# Patient Record
Sex: Female | Born: 1965 | Race: White | Hispanic: No | Marital: Married | Smoking: Never smoker
Health system: Southern US, Community
[De-identification: ages and names within clinical notes are randomized; demographics above are authoritative.]

## PROBLEM LIST (undated history)

## (undated) DIAGNOSIS — E282 Polycystic ovarian syndrome: Secondary | ICD-10-CM

## (undated) DIAGNOSIS — E785 Hyperlipidemia, unspecified: Secondary | ICD-10-CM

## (undated) DIAGNOSIS — E079 Disorder of thyroid, unspecified: Secondary | ICD-10-CM

## (undated) DIAGNOSIS — I10 Essential (primary) hypertension: Secondary | ICD-10-CM

## (undated) HISTORY — DX: Hyperlipidemia, unspecified: E78.5

## (undated) HISTORY — DX: Disorder of thyroid, unspecified: E07.9

## (undated) HISTORY — DX: Polycystic ovarian syndrome: E28.2

---

## 1998-11-26 ENCOUNTER — Other Ambulatory Visit: Admission: RE | Admit: 1998-11-26 | Discharge: 1998-11-26 | Payer: Self-pay | Admitting: *Deleted

## 1999-07-22 ENCOUNTER — Other Ambulatory Visit: Admission: RE | Admit: 1999-07-22 | Discharge: 1999-07-22 | Payer: Self-pay | Admitting: *Deleted

## 2000-01-31 ENCOUNTER — Inpatient Hospital Stay (HOSPITAL_COMMUNITY): Admission: AD | Admit: 2000-01-31 | Discharge: 2000-02-03 | Payer: Self-pay | Admitting: *Deleted

## 2000-02-04 ENCOUNTER — Encounter: Admission: RE | Admit: 2000-02-04 | Discharge: 2000-02-14 | Payer: Self-pay | Admitting: *Deleted

## 2001-06-15 ENCOUNTER — Other Ambulatory Visit: Admission: RE | Admit: 2001-06-15 | Discharge: 2001-06-15 | Payer: Self-pay | Admitting: *Deleted

## 2002-10-23 ENCOUNTER — Other Ambulatory Visit: Admission: RE | Admit: 2002-10-23 | Discharge: 2002-10-23 | Payer: Self-pay | Admitting: *Deleted

## 2004-03-11 ENCOUNTER — Other Ambulatory Visit: Admission: RE | Admit: 2004-03-11 | Discharge: 2004-03-11 | Payer: Self-pay | Admitting: Obstetrics and Gynecology

## 2004-06-29 ENCOUNTER — Ambulatory Visit: Payer: Self-pay | Admitting: Cardiology

## 2005-07-08 ENCOUNTER — Other Ambulatory Visit: Admission: RE | Admit: 2005-07-08 | Discharge: 2005-07-08 | Payer: Self-pay | Admitting: Obstetrics and Gynecology

## 2008-04-03 ENCOUNTER — Encounter: Admission: RE | Admit: 2008-04-03 | Discharge: 2008-04-03 | Payer: Self-pay | Admitting: Obstetrics and Gynecology

## 2010-07-25 ENCOUNTER — Encounter (HOSPITAL_COMMUNITY): Payer: Self-pay | Admitting: Obstetrics and Gynecology

## 2010-11-10 ENCOUNTER — Encounter: Payer: Self-pay | Admitting: Emergency Medicine

## 2010-11-10 ENCOUNTER — Inpatient Hospital Stay (INDEPENDENT_AMBULATORY_CARE_PROVIDER_SITE_OTHER)
Admission: RE | Admit: 2010-11-10 | Discharge: 2010-11-10 | Disposition: A | Discharge status: Home / Self Care | Payer: BC Managed Care – PPO | Source: Ambulatory Visit | Attending: Emergency Medicine | Admitting: Emergency Medicine

## 2010-11-10 DIAGNOSIS — E039 Hypothyroidism, unspecified: Secondary | ICD-10-CM | POA: Insufficient documentation

## 2010-11-10 DIAGNOSIS — J069 Acute upper respiratory infection, unspecified: Secondary | ICD-10-CM

## 2010-11-19 NOTE — Op Note (Signed)
[facility] of [facility]  Patient:    Kelly Reeves                         MRN: [id number] Proc. Date: 02/02/00 Adm. Date:  [id number] Attending:  Donne Hazel                           Operative Report  PREOPERATIVE DIAGNOSIS: 1. Intrauterine pregnancy at term. 2. Breech presentation.  POSTOPERATIVE DIAGNOSIS: 1. Intrauterine pregnancy at term. 2. Breech presentation.  OPERATION PERFORMED:  Primary low transverse cesarean section.  SURGEON:  Willey Blade, M.D.  ANESTHESIA:  Spinal.  ESTIMATED BLOOD LOSS:  800 cc.  COMPLICATIONS:  None.  FINDINGS:  At 1307 through a low transverse uterine incision, a viable female was delivered from the footling breech presentation.  The baby was a female as mentioned, weighing 7 pounds 2 ounces.  Apgars were 6 and 9.  Cord pH 7.32.  The pelvis was visualized at time of surgery and noted to be normal.  DESCRIPTION OF PROCEDURE:  The patient was taken to the operating room where a spinal anesthetic was administered.  The patient was placed on the operating table in the left lateral tilt position.  The abdomen was prepped and draped in the usual sterile fashion with Betadine and sterile drapes.  A Foley catheter was sterilely inserted.  The abdomen was entered through a Pfannenstiel incision and carried down sharply in the usual fashion.  The peritoneum was atraumatically entered.  The vesicouterine peritoneum overlying the lower uterine segment was incised and a bladder flap was bluntly and sharply created over the lower uterine segment.  A bladder blade was then placed behind the bladder.  The uterus was then entered through a low transverse incision and carried out laterally using the operators fingers. The membranes were entered with clear fluid noted.  A footling breech presentation was noted.  The feet were grasped and the female infant was delivered easily and promptly with the usual breech maneuvers at  1307.  The oronasopharynx was thoroughly bulb suctioned and the cord doubly clamped and cut.  The baby handed promptly to the pediatricians.  Apgars were 6 and 9. The baby did well.  Cord pH 7.32.  The placenta was then manually extracted intact with three-vessel cord without difficulty.  The interior of the uterus was wiped clean thoroughly with a wet sponge.  The uterine incision was then closed in a two-layer fashion.  The first layer a running interlocking suture of #1 Vicryl.  A second imbricating suture was placed across the primary suture line with a running stitch of #1 Vicryl as well.  The pelvis was then thoroughly irrigated with copious amounts of irrigant and good hemostasis noted.  The rectus muscle and anterior peritoneum was closed with a running stitch of #1 Vicryl.  The subfascial areas were hemostatic.  The fascia was then closed with two sutures of #1 Vicryl, this was done placing two sutures at the periphery of the incision and with two running stitches meeting in the midline.  The subcutaneous tissue was irrigated and made hemostatic using Bovie cautery.  The skin reapproximated with staples and a sterile dressing applied.  Final sponge, needle and instrument counts were correct x 3.  There were no perioperative complications.  The patient did receive IV Cefotan at time of surgery. DD:  02/02/00 TD:  02/03/00 Job: [id number]

## 2010-11-19 NOTE — H&P (Signed)
[facility] of [facility]  Patient:    Kelly Reeves                         MRN: [id number] Adm. Date:  [id number] Attending:  Donne Hazel                         History and Physical  HISTORY OF PRESENT ILLNESS:   Kelly Reeves is a 45 year old married female gravida 1 at [redacted] weeks gestation.  The patient is admitted for primary cesarean section for breech presentation.  The patient has a history of PCOS.  Her pregnancy went well and breech presentation was noted recently.  She is now admitted for primary cesarean section at term for breech presentation.  Her pregnancy went uneventfully.  OBSTETRICAL LABORATORY:        Maternal blood type O positive, rubella immune, glucola elevated with normal three-hour glucose tolerance test.  Group B strep negative.  MEDICAL HISTORY:              1. History of relative infertility.  2. History of PCOS (polycystic ovarian syndrome).  SURGICAL HISTORY:             None.  CURRENT MEDICATIONS:          Prenatal vitamins and Pepcid.  ALLERGIES:                    None known.  PHYSICAL EXAMINATION:  VITAL SIGNS:                  Stable, blood pressure 128/80, fetal heart tones are reassuring.  GENERAL:                      She is a well-developed, well-nourished female in no acute distress.  HEENT:                        Within normal limits.  NECK:                         Supple without adenopathy or thyromegaly.  HEART:                        Regular rate and rhythm without murmur, gallop, or rub.  LUNGS:                        Clear to auscultation.  BREAST:                       Deferred upon admission but has been normal within the last year.  ABDOMEN:                      Benign, gravid, and nontender.  EXTREMITIES AND NEUROLOGIC:   Grossly normal.  PELVIC:                       Deferred.  LABORATORY DATA:              Ultrasound confirms breech presentation.  ADMITTING DIAGNOSES:          1.  Intrauterine pregnancy at term.  2. Breech presentation.  PLAN:                         Primary cesarean section.  DISCUSSION:                   Risks and benefits of cesarean section discussed with patient.  The risk of bleeding, infection, risk of injury to surrounding organs discussed.  She has declined an external cephalic version attempt. Questions were answered regarding cesarean delivery. DD:  02/02/00 TD:  02/02/00 Job: [id number]

## 2010-11-19 NOTE — Discharge Summary (Signed)
[facility] of [facility]  Patient:    Kelly Reeves, Kelly Reeves                        MRN: [id number] Adm. Date:  [id number] Disc. Date: [id number] Attending:  Donne Hazel Dictator:   Danie Chandler, R.N.                           Discharge Summary  ADMITTING DIAGNOSES:          1. Intrauterine pregnancy at term.                               2. Breech presentation.  DISCHARGE DIAGNOSES:          1. Intrauterine pregnancy at term.                               2. Breech presentation.  PROCEDURE:                    On February 02, 2000, primary low transverse cesarean section.  REASON FOR ADMISSION:         The patient is a 45 year old married female gravida 1 at [redacted] weeks gestation.  The patient was admitted for a primary cesarean section for a breech presentation.  The patient has a history of PCOS.  Pregnancy has gone well, and breech presentation was noted recently. The patient was thus admitted for primary cesarean section at term for breech presentation.  HOSPITAL COURSE:              The patient was taken to the operating room, and underwent the above-named procedure without complication.  This was productive of a viable female infant with Apgars of 6 at one minute and 9 at five minutes.  Postoperatively, the patient did well.  On postoperative day #1, the patients hemoglobin was 10.7, hematocrit 32.8, and white blood cell count 8.2.  On postoperative day #2, the patient was without complaint.  She had a good return of bowel function and was tolerating a regular diet.   She was also ambulating well without difficulty and had good pain control.  She was discharged home on postoperative day #1.  CONDITION ON DISCHARGE:       Good.  DIET:                         Regular as tolerated.  ACTIVITY:                     No heavy lifting, no driving, no vaginal entry.  FOLLOW-UP:                    In one to two weeks for an incision check, and she is to call for  temperature greater than 100 degrees, persistent nausea, vomiting, heavy vaginal bleeding, and/or redness or drainage from the incision site.  DISCHARGE MEDICATIONS:        1. Prenatal vitamin one p.o. q.d.                               2. Motrin 800 mg one p.o. t.i.d. p.r.n. pain.  3. Percocet 5 mg as directed by M.D. DD:  02/17/00 TD:  02/17/00 Job: [id number]

## 2010-12-14 ENCOUNTER — Other Ambulatory Visit: Payer: Self-pay | Admitting: Obstetrics & Gynecology

## 2010-12-17 ENCOUNTER — Ambulatory Visit
Admission: RE | Admit: 2010-12-17 | Discharge: 2010-12-17 | Disposition: A | Discharge status: Home / Self Care | Payer: BC Managed Care – PPO | Source: Ambulatory Visit | Attending: Obstetrics & Gynecology | Admitting: Obstetrics & Gynecology

## 2011-06-06 NOTE — Progress Notes (Signed)
Summary: SORE THROAT/FEVER/COUGH   Vital Signs:  Patient Profile:   45 Years Old Female CC:      sore throat, dry cough, HA x 6 days, fever last night Height:     64 inches Weight:      240 pounds O2 Sat:      96 % O2 treatment:    Room Air Temp:     98.5 degrees F oral Pulse rate:   104 / minute Resp:     16 per minute BP sitting:   127 / 87  (left arm) Cuff size:   large  Vitals Entered By: Lajean Saver RN (Nov 10, 2010 12:25 PM)                  Updated Prior Medication List: LEVOTHYROXINE SODIUM 50 MCG TABS (LEVOTHYROXINE SODIUM)  JUNEL 1/20 1-20 MG-MCG TABS (NORETHINDRONE ACET-ETHINYL EST)   Current Allergies: No known allergies History of Present Illness History from: patient Chief Complaint: sore throat, dry cough, HA x 6 days, fever last night History of Present Illness: 45 Years Old Female complains of onset of cold symptoms for 6 days.  Kelly Reeves has been using nothing OTC. She went to [facility] a few days ago and tested negative for strep and flu. No sore throat + cough No pleuritic pain No wheezing No nasal congestion + post-nasal drainage + sinus pain/pressure + chest congestion No itchy/red eyes No earache No hemoptysis No SOB No chills/sweats No fever No nausea No vomiting No abdominal pain No diarrhea No skin rashes No fatigue No myalgias No headache   REVIEW OF SYSTEMS Constitutional Symptoms       Complains of fever and fatigue.     Denies chills, night sweats, weight loss, and weight gain.  Eyes       Denies change in vision, eye pain, eye discharge, glasses, contact lenses, and eye surgery. Ear/Nose/Throat/Mouth       Complains of sore throat.      Denies hearing loss/aids, change in hearing, ear pain, ear discharge, dizziness, frequent runny nose, frequent nose bleeds, sinus problems, hoarseness, and tooth pain or bleeding.  Respiratory       Complains of dry cough, wheezing, and shortness of breath.      Denies productive cough,  asthma, bronchitis, and emphysema/COPD.  Cardiovascular       Denies murmurs, chest pain, and tires easily with exhertion.    Gastrointestinal       Denies stomach pain, nausea/vomiting, diarrhea, constipation, blood in bowel movements, and indigestion. Genitourniary       Denies painful urination, kidney stones, and loss of urinary control. Neurological       Complains of headaches.      Denies paralysis, seizures, and fainting/blackouts. Musculoskeletal       Denies muscle pain, joint pain, joint stiffness, decreased range of motion, redness, swelling, muscle weakness, and gout.  Skin       Denies bruising, unusual mles/lumps or sores, and hair/skin or nail changes.  Psych       Denies mood changes, temper/anger issues, anxiety/stress, speech problems, depression, and sleep problems. Other Comments: [facility]. Rapid flu and strep were negative   Past History:  Past Medical History: Hypothyroidism POCD  Past Surgical History: Caesarean section  Family History: Family History Diabetes 1st degree relative- mother  Social History: Occupation: [employer] Never Smoked Alcohol use-no Drug use-no Married Smoking Status:  never Drug Use:  no Physical Exam General  appearance: well developed, well nourished, no acute distress Ears: normal, no lesions or deformities Nasal: mucosa pink, nonedematous, no septal deviation, turbinates normal Oral/Pharynx: tongue normal, posterior pharynx without erythema or exudate Chest/Lungs: no rales, wheezes, or rhonchi bilateral, breath sounds equal without effort Heart: regular rate and  rhythm, no murmur MSE: oriented to time, place, and person Assessment New Problems: FAMILY HISTORY DIABETES 1ST DEGREE RELATIVE (ICD-V18.0) HYPOTHYROIDISM (ICD-244.9) UPPER RESPIRATORY INFECTION, ACUTE (ICD-465.9)   Plan New Orders: New Patient Level III [99203] Pulse Oximetry (single measurment) [94760] Planning Comments:    I think this is a viral cold.  She reports that she is feeling better today but her work wanted her to get checked out.  Encourage OTC meds.  If cough worsens, pt to call and we will call in [facility].  If fever or other symptoms worsening, she will call in and we will Rx Zpak. Use nasal saline solution (over the counter) at least 3 times a day. Use over the counter decongestants like Zyrtec-D every 12 hours as needed to help with congestion. Can take tylenol every 6 hours or motrin every 8 hours for pain or fever. Follow up with your primary doctor  if no improvement in 5-7 days, sooner if increasing pain, fever, or new symptoms.    The patient and/or caregiver has been counseled thoroughly with regard to medications prescribed including dosage, schedule, interactions, rationale for use, and possible side effects and they verbalize understanding.  Diagnoses and expected course of recovery discussed and will return if not improved as expected or if the condition worsens. Patient and/or caregiver verbalized understanding.   Orders Added: 1)  New Patient Level III [99203] 2)  Pulse Oximetry (single measurment) [40347]

## 2011-10-19 ENCOUNTER — Ambulatory Visit (INDEPENDENT_AMBULATORY_CARE_PROVIDER_SITE_OTHER): Payer: BC Managed Care – PPO | Admitting: Physician Assistant

## 2011-10-19 ENCOUNTER — Encounter: Payer: Self-pay | Admitting: Physician Assistant

## 2011-10-19 VITALS — BP 153/92 | HR 94 | Ht 64.0 in | Wt 263.0 lb

## 2011-10-19 DIAGNOSIS — I10 Essential (primary) hypertension: Secondary | ICD-10-CM

## 2011-10-19 DIAGNOSIS — E88819 Insulin resistance, unspecified: Secondary | ICD-10-CM

## 2011-10-19 DIAGNOSIS — E785 Hyperlipidemia, unspecified: Secondary | ICD-10-CM

## 2011-10-19 DIAGNOSIS — E282 Polycystic ovarian syndrome: Secondary | ICD-10-CM

## 2011-10-19 DIAGNOSIS — E669 Obesity, unspecified: Secondary | ICD-10-CM

## 2011-10-19 DIAGNOSIS — E8881 Metabolic syndrome: Secondary | ICD-10-CM

## 2011-10-19 DIAGNOSIS — Z23 Encounter for immunization: Secondary | ICD-10-CM

## 2011-10-19 MED ORDER — LISINOPRIL 20 MG PO TABS: 20.0000 mg | ORAL_TABLET | Freq: Every day | ORAL | Status: DC

## 2011-10-19 NOTE — Patient Instructions (Addendum)
Start lisinopril daily. Will get labs and call with results. Follow up with endocrinologist. Recheck in 1 month. Have conversation with husband about vasectomy so you can go off birth control.

## 2011-10-19 NOTE — Progress Notes (Signed)
  Subjective:    Patient ID: Kelly Reeves, female    DOB: 07/03/1966, 46 y.o.   MRN: [id number]  HPI Patient is here to establish care. Her hx was reviewed and documented. She has PCOS and hypothyriodsim that an endocrinologist follows. She comes to PCP to help her with blood pressure problems and to help her lose weight. She is currently not doing anything to lose weight as far as exercise or diet. She wonders about appetite suppressants. She was told she had high cholesterol but has not been treated. Her mother had a carotid screening last month and her arteries were 90 and 60 percent blocked and this concerns her. She denies any headaches, chest pains or SOB. She still takes OCP because she does not want to become pregnant. Been on OCP for 20 plus years.    Review of Systems     Objective:   Physical Exam  Constitutional: She is oriented to person, place, and time. She appears well-developed and well-nourished.       Obese.  HENT:  Head: Normocephalic and atraumatic.  Eyes: Conjunctivae are normal.  Neck: Normal range of motion. Neck supple. No thyromegaly present.  Cardiovascular: Normal rate, regular rhythm, normal heart sounds and intact distal pulses.   Pulmonary/Chest: Effort normal and breath sounds normal. She has no wheezes.  Abdominal: Soft. Bowel sounds are normal.  Neurological: She is alert and oriented to person, place, and time.  Skin: Skin is warm and dry.  Psychiatric: She has a normal mood and affect. Her behavior is normal.          Assessment & Plan:  Hyperlipidemia- Patient is not on medication but was told she had elevated LDL. Will get lipid panel today and call with results.  HTN- Started on Lisinopril 20mg  daily. Educated patient about importance of diet and weight loss with blood pressure reduction. Patient was encouraged to avoid salt in her diet. She was encouraged to start walking 4-5times a week. Will recheck in 1 month.  PCOS/Inusulin  resistance- Got CMP will call with results. Endocrinology is following this.  Birth control- Discussed with patient getting off birth control because of blood pressure and age. Patient is going to talk with her husband about a vasectomy.   Obesity- I told her that endocrinology works a lot with resistant weight loss. She has not been to her follow up appointments and I encouraged her to go and get blood work to make sure hormones were in check. I also gave her quick tips on diet with increasing protein and limiting carbs and exercise. I mentioned a breast reduction due to very large breast and her difficulty exercising. She does have minimal back pain due to breast but had not consider in the past getting a reduction. She is on OCP and this may contribute to some weight gain. I think we need to work on getting her off these due to age and elevated bp. It may also help with weight. Appetite suppressants are not an option until we get bp more managable.  Need Tdap.

## 2011-10-20 LAB — COMPLETE METABOLIC PANEL WITH GFR
ALT: 38 U/L — ABNORMAL HIGH (ref 0–35)
AST: 38 U/L — ABNORMAL HIGH (ref 0–37)
Alkaline Phosphatase: 79 U/L (ref 39–117)
BUN: 10 mg/dL (ref 6–23)
Creat: 0.65 mg/dL (ref 0.50–1.10)

## 2011-10-20 LAB — LIPID PANEL
Cholesterol: 329 mg/dL — ABNORMAL HIGH (ref 0–200)
LDL Cholesterol: 206 mg/dL — ABNORMAL HIGH (ref 0–99)
Total CHOL/HDL Ratio: 7.3 Ratio
Triglycerides: 391 mg/dL — ABNORMAL HIGH (ref <150)
VLDL: 78 mg/dL — ABNORMAL HIGH (ref 0–40)

## 2011-10-21 NOTE — Patient Instructions (Addendum)
Start lisinopril daily and lets recheck in 1 month. Consider talking with husband about getting a vasectomy so you can go off of OCPs. Start walking 4-5 times a week for exercise. Limit salt and add more protien to diet. Follow up with endocrinologist. Get blood work and will call with results.

## 2011-10-22 DIAGNOSIS — I10 Essential (primary) hypertension: Secondary | ICD-10-CM | POA: Insufficient documentation

## 2011-10-22 DIAGNOSIS — E785 Hyperlipidemia, unspecified: Secondary | ICD-10-CM | POA: Insufficient documentation

## 2011-10-22 DIAGNOSIS — E282 Polycystic ovarian syndrome: Secondary | ICD-10-CM | POA: Insufficient documentation

## 2011-11-03 ENCOUNTER — Telehealth: Payer: Self-pay | Admitting: *Deleted

## 2011-11-03 NOTE — Telephone Encounter (Signed)
PT states she started Lisinopril on Monday and has taken it for 3 days. In this time period her eyes started swelling and rash and as of this morning she states that her whole face is broke out in hives and swollen. Informed pt she can take a benadryl at this time. Please advise.

## 2011-11-03 NOTE — Telephone Encounter (Signed)
Stop lisinopril. Add to allergy list.  Put her on Jades sched for tomorrow. If gets any SOB then needs to go to ED but the benadryl should really help.

## 2011-11-03 NOTE — Telephone Encounter (Signed)
Pt informed and making appt 

## 2011-11-04 ENCOUNTER — Encounter: Payer: Self-pay | Admitting: Physician Assistant

## 2011-11-04 ENCOUNTER — Ambulatory Visit (INDEPENDENT_AMBULATORY_CARE_PROVIDER_SITE_OTHER): Payer: BC Managed Care – PPO | Admitting: Physician Assistant

## 2011-11-04 VITALS — BP 107/68 | HR 86 | Temp 98.2°F | Ht 64.0 in | Wt 266.0 lb

## 2011-11-04 DIAGNOSIS — T7840XA Allergy, unspecified, initial encounter: Secondary | ICD-10-CM

## 2011-11-04 DIAGNOSIS — R634 Abnormal weight loss: Secondary | ICD-10-CM

## 2011-11-04 DIAGNOSIS — I10 Essential (primary) hypertension: Secondary | ICD-10-CM

## 2011-11-04 DIAGNOSIS — E039 Hypothyroidism, unspecified: Secondary | ICD-10-CM

## 2011-11-04 NOTE — Patient Instructions (Addendum)
Not going to prescribe any more blood pressure mediation. I know you want to go on phentermine so we will schedule a nurse visit in 3-4 weeks. If controlled scheduled office visit to start on phentarmine. Start on lipitor so we can get your cholesterol under control and recheck in 2 months. Continue to use Bendaryl for bumps on face. Get labs and will call with results on Monday.

## 2011-11-04 NOTE — Progress Notes (Signed)
  Subjective:    Patient ID: Kelly Reeves, female    DOB: 07-31-1965, 46 y.o.   MRN: [id number]  HPI Patient presents to the clinic because she believes that she had an allergic reaction to the lisinopril. She started lisinopril on Monday and by Tuesday she had tiny red itchy bumps all over her her face and her eyes seemed to be swollen. She used Benadryl and cold compresses. That seemed to help a lot. She continued to have lisinopril until yesterday which was her last dose. It seems that the bumps are much better today. Her blood pressure is wonderful today and she thinks that her blood pressure is normally really good. She denies any shortness of breath or problems swallowing.   The patient the Lipitor she stated she had not started taking it yet. She does express at today's visit she really wants to start on phentermine as soon as possible. She had great results with medication previously. She has started exercising daily by walking.  Patient is frustrated with not been able to get into her endocrinologist. She would like her need to take over managing her levothyroxine. She has not had her thyroid checked in over a year.    Review of Systems     Objective:   Physical Exam  Constitutional: She is oriented to person, place, and time. She appears well-developed and well-nourished. No distress.  HENT:  Head: Normocephalic and atraumatic.  Eyes: Conjunctivae are normal. Right eye exhibits no discharge. Left eye exhibits no discharge.       There is no eye swelling today.  Cardiovascular: Normal rate, regular rhythm and normal heart sounds.   Pulmonary/Chest: Effort normal and breath sounds normal. She has no wheezes.  Neurological: She is alert and oriented to person, place, and time.  Skin: She is not diaphoretic.       Tiny red papules with some erythema over bilateral cheek bones and down into the lower jaw area.  Psychiatric: She has a normal mood and affect. Her behavior is normal.           Assessment & Plan:  Allergic reaction- we did the lisinopril on her allergic list she was told not to take any more. Patient did not want to be put on any type of prednisone. She does think that her bumps are slowly going away and she will remain on Benadryl as needed.  HTN/weight loss- her blood pressure looked wonderful today. We will not start any new blood pressure medicine today. Patient will followup with nurse visit in 3 weeks. If still in normal range she will schedule appointment to be started on phentermine. She has started exercising regularly. She has made some diet changes but she knows that this is the next step in weight loss.  Hypothyroidism-I will take over managing her hypothyroidism. We will get a TSH today. Patient will be called with results and any changes will be made at that time.  She is aware that she needs a complete physical and will schedule.

## 2011-11-05 LAB — T3, FREE: T3, Free: 2.7 pg/mL (ref 2.3–4.2)

## 2011-11-05 LAB — TSH: TSH: 3.057 u[IU]/mL (ref 0.350–4.500)

## 2011-11-05 LAB — T4, FREE: Free T4: 1 ng/dL (ref 0.80–1.80)

## 2011-11-23 ENCOUNTER — Ambulatory Visit: Payer: BC Managed Care – PPO | Admitting: Physician Assistant

## 2011-12-02 ENCOUNTER — Ambulatory Visit (INDEPENDENT_AMBULATORY_CARE_PROVIDER_SITE_OTHER): Payer: BC Managed Care – PPO | Admitting: Physician Assistant

## 2011-12-02 ENCOUNTER — Encounter: Payer: Self-pay | Admitting: Physician Assistant

## 2011-12-02 VITALS — BP 136/83 | HR 83 | Temp 98.0°F | Wt 266.0 lb

## 2011-12-02 DIAGNOSIS — R82998 Other abnormal findings in urine: Secondary | ICD-10-CM

## 2011-12-02 DIAGNOSIS — I10 Essential (primary) hypertension: Secondary | ICD-10-CM

## 2011-12-02 DIAGNOSIS — Z8249 Family history of ischemic heart disease and other diseases of the circulatory system: Secondary | ICD-10-CM

## 2011-12-02 DIAGNOSIS — R8290 Unspecified abnormal findings in urine: Secondary | ICD-10-CM

## 2011-12-02 DIAGNOSIS — E785 Hyperlipidemia, unspecified: Secondary | ICD-10-CM

## 2011-12-02 LAB — POCT URINALYSIS DIPSTICK
Glucose, UA: NEGATIVE
Leukocytes, UA: NEGATIVE
Nitrite, UA: NEGATIVE
Urobilinogen, UA: 0.2

## 2011-12-02 MED ORDER — ATORVASTATIN CALCIUM 40 MG PO TABS: 40.0000 mg | ORAL_TABLET | Freq: Every day | ORAL | Status: DC

## 2011-12-02 MED ORDER — LOSARTAN POTASSIUM-HCTZ 50-12.5 MG PO TABS: 1.0000 | ORAL_TABLET | Freq: Every day | ORAL | Status: DC

## 2011-12-02 NOTE — Patient Instructions (Addendum)
Can start taking an asprin daily. Lipitor 40mg  once a day along with Losartan/HCTZ once a day. Recheck in 2 month. Start walking. Will send culture. Call office if not improving after your period resolves.

## 2011-12-04 NOTE — Progress Notes (Signed)
  Subjective:    Patient ID: Kelly Reeves, female    DOB: Apr 17, 1966, 46 y.o.   MRN: [id number]  HPI Patient came in for blood pressure check and noticed an orange color to urine. She wanted to be checked for UTI. She denies any pain, discomfort, pressure. She has not had a fever, chills, muscle aches, of vaginal discharge. She has only had orange color urine for today and not noticed before. She has not started any new medications or eaten any carrots last night. Not tried anything to make better.   Never started Lipitor. Needs to be called in to a different pharmacy. Blood presure elevated. Will start bp meds. Brother had a heart attack last week at 22. Pt is very scared that she is next. She wants to know how to prevent this. Denies CP, SOB, palpiations, dizziness, not feeling well and headaches. Not exercising or eating healthy.    Review of Systems     Objective:   Physical Exam  Constitutional: She is oriented to person, place, and time. She appears well-developed and well-nourished.       Obese.  HENT:  Head: Normocephalic and atraumatic.  Eyes: Conjunctivae are normal.  Neck: Normal range of motion. Neck supple.  Cardiovascular: Normal rate, regular rhythm, normal heart sounds and intact distal pulses.   Pulmonary/Chest: Effort normal and breath sounds normal.       No cva tenderness.  Abdominal: Soft. Bowel sounds are normal. There is no tenderness.  Neurological: She is alert and oriented to person, place, and time.  Skin: Skin is warm and dry.  Psychiatric: She has a normal mood and affect. Her behavior is normal.          Assessment & Plan:  Urine abnormality- UA positive for blood and orange color urine, no leuks or nitrites. Reports to have started period today. Will send urine for culture. If continue to have problems after menstural cycle call office. Likely due to something you ate or the start of your peroid.   Hyperlipidemia- Called in lipitor 40mg  to start  daily. Encouraged patient to exercise daily even if it is just walking for 30 minutes. Will recheck in 2 months.  hypertension- Start Hyzarr for blood pressure daily. Call if any allergic reaction or not able to tolerate. Will recheck in 2 months.   Family hx of heart attack- Reassured patient that the best treatment is to control HTN, Hyperlipidemia, not progress to diabetes and to lose weight. We are working on all of those treatments. Can start ASA baby daily. Make better diet decsions also. At 2 month lets get a EKG for baseline.

## 2011-12-05 LAB — CULTURE, URINE COMPREHENSIVE

## 2012-02-03 ENCOUNTER — Ambulatory Visit: Payer: BC Managed Care – PPO | Admitting: Physician Assistant

## 2012-02-28 ENCOUNTER — Other Ambulatory Visit: Payer: Self-pay | Admitting: Physician Assistant

## 2012-04-04 ENCOUNTER — Ambulatory Visit (INDEPENDENT_AMBULATORY_CARE_PROVIDER_SITE_OTHER): Payer: BC Managed Care – PPO | Admitting: Physician Assistant

## 2012-04-04 ENCOUNTER — Encounter: Payer: Self-pay | Admitting: Physician Assistant

## 2012-04-04 VITALS — BP 123/76 | HR 79 | Ht 64.0 in | Wt 267.0 lb

## 2012-04-04 DIAGNOSIS — I10 Essential (primary) hypertension: Secondary | ICD-10-CM

## 2012-04-04 DIAGNOSIS — E669 Obesity, unspecified: Secondary | ICD-10-CM

## 2012-04-04 DIAGNOSIS — E785 Hyperlipidemia, unspecified: Secondary | ICD-10-CM

## 2012-04-04 DIAGNOSIS — Z23 Encounter for immunization: Secondary | ICD-10-CM

## 2012-04-04 DIAGNOSIS — E039 Hypothyroidism, unspecified: Secondary | ICD-10-CM

## 2012-04-04 MED ORDER — PHENTERMINE HCL 37.5 MG PO CAPS: 37.5000 mg | ORAL_CAPSULE | ORAL | Status: DC

## 2012-04-04 NOTE — Progress Notes (Addendum)
  Subjective:    Patient ID: Kelly Reeves, female    DOB: February 27, 1966, 46 y.o.   MRN: [id number]  HPI Patient is a 46 year old female who presents to the clinic to followup own hypertension, hypothyroidism, hyperlipidemia and problems with weight loss.  Hypertension-patient has been off Hyzaar for the past 2-3 weeks and blood pressure is still control. Patient denies any chest pains, palpitations, headaches, or vision changes.  Hypothyroidism/obesity-patient's TSH was last checked in May and patient was kept on same dose of Synthroid. For the last 6 weeks she has tried to lose weight by speaking to a 1200-1500-calorie diet and exercise and regularly. She has not been able  to lose weight. She is very frustrated. Patient has very large breasts and she reports that the large breasts make it really hard for her to exercise and she reports significant back pain. She has considered her breast reduction. She has not seen a Engineer, petroleum for consult at this time. She denies any hair loss. She has been more cold intolerant lately.  Hyperlipidemia-patient has severe muscle aches with Lipitor. She has not been taking. She denies any muscle pain or cramps today.   Review of Systems     Objective:   Physical Exam  Constitutional: She is oriented to person, place, and time. She appears well-developed and well-nourished.       obese  HENT:  Head: Normocephalic and atraumatic.  Neck: Normal range of motion. Neck supple. No thyromegaly present.  Cardiovascular: Normal rate, regular rhythm and normal heart sounds.   Pulmonary/Chest: Effort normal and breath sounds normal. She has no wheezes.  Neurological: She is alert and oriented to person, place, and time.  Skin: Skin is warm and dry.  Psychiatric: She has a normal mood and affect. Her behavior is normal.          Assessment & Plan:  Hypertension-blood pressure well controlled today despite not being on medication. Will not refill medications  can continue to monitor blood pressure. Followup in 1 month for blood pressure check.  Hypothyroidism/obesity-since patient is having a harder time losing weight will order TSH to see if any medication adjustments need to be made. Encouraged patient to continue regular exercise and low-calorie diet. Did give phentermine to start taking as appetite suppressant. We'll continue to monitor monthly for increase in blood pressure or side effects. Discussed with patient the importance of small meals during the day.  Hyperlipidemia-the need to recheck cholesterol level since patient has not been on Lipitor. Lipitor was added to the intolerant list. Did give samples of level 2 mg to take daily. Patient was told to call office if she did not experience side effects and we could give her a prescription. Did discuss with patient that if she did have side effects with little low to consider Welchol.  Flu shot given.

## 2012-04-04 NOTE — Patient Instructions (Addendum)
Let's get TSH and will call with results.   Will give samples of livalo and see if you are able to tolerate.

## 2012-04-14 LAB — T4, FREE: TSH: 1.233

## 2012-04-17 ENCOUNTER — Telehealth: Payer: Self-pay | Admitting: *Deleted

## 2012-04-17 NOTE — Telephone Encounter (Signed)
Pt calling wanting lab ressults

## 2012-04-18 ENCOUNTER — Telehealth: Payer: Self-pay | Admitting: Physician Assistant

## 2012-04-18 NOTE — Telephone Encounter (Signed)
LMOM informing Pt  

## 2012-04-18 NOTE — Telephone Encounter (Signed)
Will you call downstairs and find out why we never received any results for patient? Let patient know we were never resulted with anything and will let her know as soon as we do about results.

## 2012-04-18 NOTE — Telephone Encounter (Signed)
Call pt: Labs were never sent to [place]. TSH is great. It's actually more toward the Hyper side of normal. T3 and t4 are also both normal.

## 2012-04-19 ENCOUNTER — Encounter: Payer: Self-pay | Admitting: *Deleted

## 2012-05-02 ENCOUNTER — Encounter: Payer: BC Managed Care – PPO | Admitting: Family Medicine

## 2012-05-02 ENCOUNTER — Other Ambulatory Visit: Payer: Self-pay | Admitting: Physician Assistant

## 2012-05-02 ENCOUNTER — Ambulatory Visit (INDEPENDENT_AMBULATORY_CARE_PROVIDER_SITE_OTHER): Payer: BC Managed Care – PPO | Admitting: Physician Assistant

## 2012-05-02 VITALS — BP 139/90 | HR 87 | Wt 260.0 lb

## 2012-05-02 DIAGNOSIS — R635 Abnormal weight gain: Secondary | ICD-10-CM

## 2012-05-02 DIAGNOSIS — E669 Obesity, unspecified: Secondary | ICD-10-CM

## 2012-05-02 MED ORDER — PITAVASTATIN CALCIUM 2 MG PO TABS: ORAL_TABLET | ORAL | Status: DC

## 2012-05-02 MED ORDER — PHENTERMINE HCL 30 MG PO CAPS: 30.0000 mg | ORAL_CAPSULE | ORAL | Status: DC

## 2012-05-02 MED ORDER — METFORMIN HCL 1000 MG PO TABS: 1000.0000 mg | ORAL_TABLET | Freq: Two times a day (BID) | ORAL | Status: DC

## 2012-05-02 NOTE — Progress Notes (Signed)
Kelly Reeves will you confirm dosage of metformin at 2,000 daily?   Also have coupon at office for 1 free month of livalo and then pick up card to take to pharmacy for discount price. Will need to pick up. Will send metformin to pharmacy once dose confirmed.

## 2012-05-02 NOTE — Progress Notes (Signed)
Pt is taking 2000mg  of metformin

## 2012-05-02 NOTE — Progress Notes (Signed)
  Subjective:    Patient ID: Kelly Reeves, female    DOB: 1966-03-18, 46 y.o.   MRN: [id number]  HPI    Review of Systems     Objective:   Physical Exam        Assessment & Plan:  Pt down 7 lbs. Great weight loss. BP has increased a little. Will decrease phentermine to 30mg . Recheck in 1 month. Keep up diet and exercise. Tandy Gaw PA-C

## 2012-05-02 NOTE — Progress Notes (Signed)
Patient ID: Kelly Reeves, female   DOB: 06-Sep-1965, 45 y.o.   MRN: [id number] Livalo samples worked really well for Pt and she would like Rx sent to pharm. Pt also wanted to know if you will refill Metformin for PCOS. Please advise.

## 2012-05-02 NOTE — Progress Notes (Signed)
  Subjective:    Patient ID: Kelly Reeves, female    DOB: 1965-12-29, 46 y.o.   MRN: [id number]  HPI   Patient doing well on appetite suppressant.  Here for nurse visit, weight, BP, HR check.  Denies problems with insomnia, heart palpitations or tremors.  Satisfied with weight loss thus far and is working on [employer] and regular exercise.     Pt states the samples of the cholesterol medication worked well without side effects and she will need a rx.Also would like [facility] PA-C to refill her metformin Review of Systems     Objective:   Physical Exam        Assessment & Plan:

## 2012-05-02 NOTE — Patient Instructions (Signed)
Recheck 1 month

## 2012-05-11 ENCOUNTER — Other Ambulatory Visit: Payer: Self-pay | Admitting: *Deleted

## 2012-05-11 MED ORDER — LEVOTHYROXINE SODIUM 125 MCG PO TABS: 125.0000 ug | ORAL_TABLET | Freq: Every day | ORAL | Status: DC

## 2012-06-04 ENCOUNTER — Ambulatory Visit: Payer: BC Managed Care – PPO | Admitting: Physician Assistant

## 2012-06-13 ENCOUNTER — Ambulatory Visit: Payer: BC Managed Care – PPO | Admitting: Physician Assistant

## 2012-06-13 DIAGNOSIS — Z0289 Encounter for other administrative examinations: Secondary | ICD-10-CM

## 2012-07-27 ENCOUNTER — Other Ambulatory Visit: Payer: Self-pay | Admitting: Physician Assistant

## 2012-09-02 ENCOUNTER — Other Ambulatory Visit: Payer: Self-pay | Admitting: Physician Assistant

## 2012-10-05 ENCOUNTER — Other Ambulatory Visit: Payer: Self-pay | Admitting: Obstetrics and Gynecology

## 2012-10-05 DIAGNOSIS — N644 Mastodynia: Secondary | ICD-10-CM

## 2012-10-15 ENCOUNTER — Other Ambulatory Visit: Payer: Self-pay | Admitting: Family Medicine

## 2012-10-17 ENCOUNTER — Ambulatory Visit
Admission: RE | Admit: 2012-10-17 | Discharge: 2012-10-17 | Disposition: A | Discharge status: Home / Self Care | Payer: BC Managed Care – PPO | Source: Ambulatory Visit | Attending: Obstetrics and Gynecology | Admitting: Obstetrics and Gynecology

## 2012-10-17 ENCOUNTER — Other Ambulatory Visit: Payer: Self-pay | Admitting: Obstetrics and Gynecology

## 2012-10-17 DIAGNOSIS — N644 Mastodynia: Secondary | ICD-10-CM

## 2012-10-22 ENCOUNTER — Other Ambulatory Visit: Payer: Self-pay | Admitting: Family Medicine

## 2012-10-24 ENCOUNTER — Encounter: Payer: Self-pay | Admitting: Physician Assistant

## 2012-10-24 ENCOUNTER — Ambulatory Visit (INDEPENDENT_AMBULATORY_CARE_PROVIDER_SITE_OTHER): Payer: BC Managed Care – PPO | Admitting: Physician Assistant

## 2012-10-24 VITALS — BP 133/88 | HR 96 | Ht 64.0 in | Wt 269.0 lb

## 2012-10-24 DIAGNOSIS — E039 Hypothyroidism, unspecified: Secondary | ICD-10-CM

## 2012-10-24 DIAGNOSIS — E785 Hyperlipidemia, unspecified: Secondary | ICD-10-CM

## 2012-10-24 DIAGNOSIS — E282 Polycystic ovarian syndrome: Secondary | ICD-10-CM

## 2012-10-24 DIAGNOSIS — I10 Essential (primary) hypertension: Secondary | ICD-10-CM

## 2012-10-24 MED ORDER — METFORMIN HCL 1000 MG PO TABS: 1000.0000 mg | ORAL_TABLET | Freq: Two times a day (BID) | ORAL | Status: DC

## 2012-10-24 MED ORDER — PITAVASTATIN CALCIUM 2 MG PO TABS: ORAL_TABLET | ORAL | Status: DC

## 2012-10-24 MED ORDER — LORCASERIN HCL 10 MG PO TABS: 10.0000 mg | ORAL_TABLET | Freq: Two times a day (BID) | ORAL | Status: DC

## 2012-10-24 MED ORDER — LEVOTHYROXINE SODIUM 125 MCG PO TABS: 125.0000 ug | ORAL_TABLET | Freq: Every day | ORAL | Status: DC

## 2012-10-24 NOTE — Progress Notes (Signed)
  Subjective:    Patient ID: Kelly Reeves. Apperson, female    DOB: 12-27-1965, 47 y.o.   MRN: [id number]  HPI Patient is a 47 year old female who presents to the clinic with history of hypothyroidism, hyperlipidemia, hypertension, PCO S., and obesity. She's recently gotten really frustrated with her medications and has stopped all medications for the last 2 months.  Hypothyroidism currently uncontrolled instructed on medications for 2 months. Patient does report a 9 pound weight gain from last visit. She does feel more tired and fatigued. She does report feeling some depression.  Hyperlipidemia currently uncontrolled since not taking livalo. Currently not abiding by any low-fat diet.  Hypertension is currently controlled without any medication.  PCOS/Obesity- patient continues to gain weight with 9 pounds since last weight check in October. She is not taking her metformin. She is very discouraged about her weight. She admits to not exercising and not dieting. Her periods are regulated on birth control.  Review of Systems     Objective:   Physical Exam  Constitutional: She is oriented to person, place, and time. She appears well-developed and well-nourished.  Obese  HENT:  Head: Normocephalic and atraumatic.  Eyes: Conjunctivae are normal.  Neck: Normal range of motion. Neck supple. No thyromegaly present.  Cardiovascular: Normal rate, regular rhythm and normal heart sounds.   Pulmonary/Chest: Effort normal and breath sounds normal. She has no wheezes.  Neurological: She is alert and oriented to person, place, and time.  Skin: Skin is warm and dry.  Psychiatric: She has a normal mood and affect. Her behavior is normal.          Assessment & Plan:  Hypothyroidism-we'll not recheck labs today since not been on medication we'll restart Synthroid at 125 MCG daily. Will recheck in 3 months.  Hyperlipidemia-we'll not recheck lipid panel today but will restart level and recheck in 3 months.  Patient reminded of the importance of diet changes in lowering cholesterol as well as exercising.  Hypertension-controlled today on no meds. We'll continue to monitor.  PCO S.-patient is currently on birth control and doing well. Will start back on metformin and will recheck A1c in 3 months.  Obesity-discussed the medication pelvic as a long-term weight management option. Day 15 day 3 samples discussed the common side effects of headaches and nausea. Spent over 30 minutes talking about ways of healthier living. Discussed calorie counting and keeping calories a low 1200. Discussed portion control and preplanning [place] know what she is eating. Discussed exercising at least 30 minutes a day. Discussed ways to stop eating such as brushing teeth after meals. We'll continue to monitor her weight loss. Recheck in 3 months.  30 minutes with patient greater than 50% of encounter was spent discussing weight management.

## 2012-11-28 ENCOUNTER — Telehealth: Payer: Self-pay | Admitting: *Deleted

## 2012-11-28 ENCOUNTER — Emergency Department (HOSPITAL_BASED_OUTPATIENT_CLINIC_OR_DEPARTMENT_OTHER): Payer: BC Managed Care – PPO

## 2012-11-28 ENCOUNTER — Emergency Department (HOSPITAL_BASED_OUTPATIENT_CLINIC_OR_DEPARTMENT_OTHER)
Admission: EM | Admit: 2012-11-28 | Discharge: 2012-11-28 | Disposition: A | Discharge status: Home / Self Care | Payer: BC Managed Care – PPO | Attending: Emergency Medicine | Admitting: Emergency Medicine

## 2012-11-28 ENCOUNTER — Encounter (HOSPITAL_BASED_OUTPATIENT_CLINIC_OR_DEPARTMENT_OTHER): Payer: Self-pay | Admitting: *Deleted

## 2012-11-28 DIAGNOSIS — M25519 Pain in unspecified shoulder: Secondary | ICD-10-CM | POA: Insufficient documentation

## 2012-11-28 DIAGNOSIS — E785 Hyperlipidemia, unspecified: Secondary | ICD-10-CM | POA: Insufficient documentation

## 2012-11-28 DIAGNOSIS — Z79899 Other long term (current) drug therapy: Secondary | ICD-10-CM | POA: Insufficient documentation

## 2012-11-28 DIAGNOSIS — R0789 Other chest pain: Secondary | ICD-10-CM

## 2012-11-28 DIAGNOSIS — E282 Polycystic ovarian syndrome: Secondary | ICD-10-CM | POA: Insufficient documentation

## 2012-11-28 DIAGNOSIS — M549 Dorsalgia, unspecified: Secondary | ICD-10-CM | POA: Insufficient documentation

## 2012-11-28 DIAGNOSIS — E079 Disorder of thyroid, unspecified: Secondary | ICD-10-CM | POA: Insufficient documentation

## 2012-11-28 DIAGNOSIS — I10 Essential (primary) hypertension: Secondary | ICD-10-CM | POA: Insufficient documentation

## 2012-11-28 HISTORY — DX: Essential (primary) hypertension: I10

## 2012-11-28 LAB — CBC WITH DIFFERENTIAL/PLATELET
Basophils Absolute: 0 10*3/uL (ref 0.0–0.1)
Eosinophils Absolute: 0.3 10*3/uL (ref 0.0–0.7)
Eosinophils Relative: 4 % (ref 0–5)
HCT: 41.6 % (ref 36.0–46.0)
Lymphocytes Relative: 33 % (ref 12–46)
Lymphs Abs: 2.4 10*3/uL (ref 0.7–4.0)
MCH: 30.9 pg (ref 26.0–34.0)
MCV: 89.3 fL (ref 78.0–100.0)
Monocytes Absolute: 0.7 10*3/uL (ref 0.1–1.0)
Platelets: 411 10*3/uL — ABNORMAL HIGH (ref 150–400)
RDW: 13.9 % (ref 11.5–15.5)

## 2012-11-28 LAB — COMPREHENSIVE METABOLIC PANEL
ALT: 26 U/L (ref 0–35)
CO2: 26 mEq/L (ref 19–32)
Calcium: 9.7 mg/dL (ref 8.4–10.5)
Creatinine, Ser: 0.7 mg/dL (ref 0.50–1.10)
GFR calc Af Amer: >90 mL/min (ref >90)
GFR calc non Af Amer: >90 mL/min (ref >90)
Glucose, Bld: 100 mg/dL — ABNORMAL HIGH (ref 70–99)
Sodium: 138 mEq/L (ref 135–145)
Total Protein: 8.3 g/dL (ref 6.0–8.3)

## 2012-11-28 MED ORDER — CYCLOBENZAPRINE HCL 10 MG PO TABS: 10.0000 mg | ORAL_TABLET | Freq: Two times a day (BID) | ORAL | Status: DC | PRN

## 2012-11-28 MED ORDER — ASPIRIN 81 MG PO CHEW
324.0000 mg | CHEWABLE_TABLET | Freq: Once | ORAL | Status: AC
  Administered 2012-11-28: 324 mg via ORAL
  Filled 2012-11-28: qty 4

## 2012-11-28 MED ORDER — IBUPROFEN 800 MG PO TABS: 800.0000 mg | ORAL_TABLET | Freq: Three times a day (TID) | ORAL | Status: DC

## 2012-11-28 MED ORDER — IOHEXOL 350 MG/ML SOLN
100.0000 mL | Freq: Once | INTRAVENOUS | Status: AC | PRN
  Administered 2012-11-28: 100 mL via INTRAVENOUS

## 2012-11-28 NOTE — Telephone Encounter (Signed)
Pt calls & states that starting around noon today she started experiencing discomfort/achy feelings in her upper back, across shoulder, & back of her neck.  She states that she had a "really weird, sorta of pulling" sensation in the center of her chest.  Discomfort in going down right arm but has subsided for the most part.  fam hx is her brother had a heart attack last year.  Please advise on recommendations for her.

## 2012-11-28 NOTE — ED Provider Notes (Signed)
History     CSN: [id number]  Arrival date & time 11/28/12  1706   First MD Initiated Contact with Patient 11/28/12 1714      Chief Complaint  Patient presents with  . Chest Pain    (Consider location/radiation/quality/duration/timing/severity/associated sxs/prior treatment) HPI Comments: Patient complains of achy  sensation across her upper shoulders and back and neck that started around noon today has been constant lasting for several hours. It is worse with position change. She denies any injury. She got concerned when she started feeling a pinching sensation in the right side of her chest that lasted about a minute and resolved. No shortness of breath or nausea. Denies any cardiac history. She has a history of hyperlipidemia PCU S. No diabetes. She does not smoke. She is on birth control. No leg pain or swelling. No abdominal pain, nausea or vomiting. She's never had a stress test.she denies any weakness, numbness or tingling in her arms. No headache or vision change.  The history is provided by the patient.    Past Medical History  Diagnosis Date  . Hyperlipidemia   . Thyroid disease   . PCOS (polycystic ovarian syndrome)   . Hypertension     Past Surgical History  Procedure Laterality Date  . Cesarean section  01/31/00    Family History  Problem Relation Age of Onset  . Depression Mother   . Hyperlipidemia Maternal Grandmother   . Depression Maternal Grandmother   . Colon cancer Maternal Grandmother   . Hyperlipidemia Maternal Grandfather   . Depression Maternal Grandfather   . Hyperlipidemia Paternal Grandmother   . Hypertension Paternal Grandmother   . Hyperlipidemia Paternal Grandfather   . Hypertension Paternal Grandfather   . Heart attack Brother     History  Substance Use Topics  . Smoking status: Never Smoker   . Smokeless tobacco: Not on file  . Alcohol Use: No    OB History   Grav Para Term Preterm Abortions TAB SAB Ect Mult Living                   Review of Systems  Cardiovascular: Positive for chest pain.    Allergies  Atorvastatin and Lisinopril  Home Medications   Current Outpatient Rx  Name  Route  Sig  Dispense  Refill  . levothyroxine (SYNTHROID, LEVOTHROID) 125 MCG tablet   Oral   Take 1 tablet (125 mcg total) by mouth daily before breakfast.   30 tablet   3   . Lorcaserin HCl (BELVIQ) 10 MG TABS   Oral   Take 10 mg by mouth 2 (two) times daily.   30 tablet   0   . Lorcaserin HCl (BELVIQ) 10 MG TABS   Oral   Take 10 mg by mouth 2 (two) times daily.   60 tablet   2   . metFORMIN (GLUCOPHAGE) 1000 MG tablet   Oral   Take 1 tablet (1,000 mg total) by mouth 2 (two) times daily with a meal.   60 tablet   6   . norethindrone (MICRONOR,CAMILA,ERRIN) 0.35 MG tablet   Oral   Take 1 tablet by mouth daily.         . Pitavastatin Calcium 2 MG TABS      Take one tablet daily for cholesterol.   30 tablet   3     BP 157/94  Pulse 96  Temp(Src) 98.5 F (36.9 C) (Oral)  Resp 20  Ht 5\' 4"  (1.626 m)  Wt  269 lb (122.018 kg)  BMI 46.15 kg/m2  SpO2 98%  LMP 10/29/2012  Physical Exam  Constitutional: She is oriented to person, place, and time. She appears well-developed and well-nourished. No distress.  HENT:  Head: Normocephalic and atraumatic.  Mouth/Throat: Oropharynx is clear and moist. No oropharyngeal exudate.  Eyes: Conjunctivae and EOM are normal. Pupils are equal, round, and reactive to light.  Neck: Normal range of motion. Neck supple.  Cardiovascular: Normal rate, regular rhythm and normal heart sounds.   No murmur heard. Pulmonary/Chest: Effort normal and breath sounds normal. No respiratory distress. She exhibits no tenderness.  Abdominal: Soft. There is no tenderness. There is no rebound and no guarding.  Musculoskeletal: Normal range of motion. She exhibits no edema and no tenderness.  Equal grip strength bilaterally. Tenderness to palpation across the upper shoulders and back.   Neurological: She is alert and oriented to person, place, and time. No cranial nerve deficit. She exhibits normal muscle tone. Coordination normal.  Skin: Skin is warm.    ED Course  Procedures (including critical care time)  Labs Reviewed  CBC WITH DIFFERENTIAL - Abnormal; Notable for the following:    Platelets 411 (*)    All other components within normal limits  COMPREHENSIVE METABOLIC PANEL - Abnormal; Notable for the following:    Glucose, Bld 100 (*)    All other components within normal limits  D-DIMER, QUANTITATIVE - Abnormal; Notable for the following:    D-Dimer, Quant 1.48 (*)    All other components within normal limits  TROPONIN I   Dg Chest 2 View  11/28/2012   *RADIOLOGY REPORT*  Clinical Data:  Right-sided chest pain.  CHEST - 2 VIEW  Comparison: None  Findings: The heart size and mediastinal contours are within normal limits.  Both lungs are clear.  The visualized skeletal structures are unremarkable.  IMPRESSION: No active disease.   Original Report Authenticated By: Irish Lack, M.D.   Ct Angio Chest Pe W/cm &/or Wo Cm  11/28/2012   *RADIOLOGY REPORT*  Clinical Data: Chest and back pain, on birth control pills, elevated D-dimer  CT ANGIOGRAPHY CHEST  Technique:  Multidetector CT imaging of the chest using the standard protocol during bolus administration of intravenous contrast. Multiplanar reconstructed images including MIPs were obtained and reviewed to evaluate the vascular anatomy.  Contrast: OMNIPAQUE IOHEXOL 350 MG/ML SOLN  Comparison: Chest x-ray of 11/28/2012  Findings: The pulmonary arteries are well opacified and there is no evidence of acute pulmonary embolism.  The thoracic aorta opacifies with no acute abnormality.  The origins of the great vessels are patent.  No mediastinal or hilar adenopathy is seen with only small mediastinal nodes present.  No pericardial effusion is noted.  The portion of the upper abdomen that is visualized is unremarkable.   On the lung window images, no lung parenchymal lesion is seen.  No infiltrate is noted and there is no evidence of pleural effusion. The central airway is patent.  No bony abnormality is seen.  IMPRESSION: Negative CT angiogram of the chest.   Original Report Authenticated By: Dwyane Dee, M.D.     No diagnosis found.    MDM  Aches in shoulder and neck that is constant and worse with position change. Fleeting episode of chest pinching that is resolved.  No neuro deficits, equal grip strengths.  chest x-ray negative. EKG normal sinus rhythm  Patient's pain is atypical for ACS. He is pain-free at this point. Troponin negative, D-dimer positive. CTA chest shows  no PE or dissection.  Suspect her shoulder and neck pain is musculoskeletal. No weakness, numbness, tingling. Chest pain is atypical for ACS.  Will treat supportively. Followup with PCP. Return precautions discussed.   Date: 11/28/2012  Rate: 85  Rhythm: normal sinus rhythm  QRS Axis: normal  Intervals: normal  ST/T Wave abnormalities: nonspecific ST/T changes  Conduction Disutrbances:none  Narrative Interpretation:   Old EKG Reviewed: none available    Glynn Octave, MD 11/29/12 [phone]

## 2012-11-28 NOTE — Telephone Encounter (Signed)
I spoke with pt and she said she will try the Crestor if you will send that in since insurance will not cover the Livalo until has tried either crestor, vytorin.

## 2012-11-28 NOTE — Telephone Encounter (Signed)
Take a 325 ASA and I would go to ER for EKG and serial cardiac enzymes.

## 2012-11-28 NOTE — ED Notes (Signed)
Pt reports sitting at desk at noon today "achy" feeling in both shoulder across chest and neck radiates down both arms , denies SOB or nausea

## 2012-11-28 NOTE — Telephone Encounter (Signed)
Pt notified to take aspirin & go to ED

## 2012-12-03 ENCOUNTER — Telehealth: Payer: Self-pay | Admitting: *Deleted

## 2012-12-03 NOTE — Telephone Encounter (Signed)
Prior auth approved for [employer]

## 2013-01-02 ENCOUNTER — Ambulatory Visit (INDEPENDENT_AMBULATORY_CARE_PROVIDER_SITE_OTHER): Payer: BC Managed Care – PPO | Admitting: Family Medicine

## 2013-01-02 ENCOUNTER — Encounter: Payer: Self-pay | Admitting: Family Medicine

## 2013-01-02 VITALS — BP 139/90 | HR 77 | Wt 266.0 lb

## 2013-01-02 DIAGNOSIS — J329 Chronic sinusitis, unspecified: Secondary | ICD-10-CM

## 2013-01-02 DIAGNOSIS — J029 Acute pharyngitis, unspecified: Secondary | ICD-10-CM

## 2013-01-02 DIAGNOSIS — A499 Bacterial infection, unspecified: Secondary | ICD-10-CM

## 2013-01-02 LAB — POCT RAPID STREP A (OFFICE): Rapid Strep A Screen: NEGATIVE

## 2013-01-02 MED ORDER — CEFDINIR 300 MG PO CAPS: 300.0000 mg | ORAL_CAPSULE | Freq: Two times a day (BID) | ORAL | Status: DC

## 2013-01-02 MED ORDER — PREDNISONE 20 MG PO TABS: ORAL_TABLET | ORAL | Status: AC

## 2013-01-02 NOTE — Progress Notes (Signed)
CC: Kelly Reeves is a 47 y.o. female is here for Sore Throat   Subjective: HPI:  Patient is very sore throat that has been present for 2 weeks described as moderate in severity as radiating into both years with a pressure sensation in ears. Sore throat is described as burning when swallowing. Worse only when swallowing rest improves pain. She was started on Augmentin 2 Saturdays ago with resolution of pain up until this past Saturday when medication was stopped and symptoms returned full force on Sunday night. Since then she has had mild nasal congestion without facial pain. She has had a cough but this is resolved. She denies fevers, chills, nausea, vomiting, wheezing, orthopnea, dizziness, headache. Sore throat is somewhat improved with ibuprofen   Review Of Systems Outlined In HPI  Past Medical History  Diagnosis Date  . Hyperlipidemia   . Thyroid disease   . PCOS (polycystic ovarian syndrome)   . Hypertension      Family History  Problem Relation Age of Onset  . Depression Mother   . Hyperlipidemia Maternal Grandmother   . Depression Maternal Grandmother   . Colon cancer Maternal Grandmother   . Hyperlipidemia Maternal Grandfather   . Depression Maternal Grandfather   . Hyperlipidemia Paternal Grandmother   . Hypertension Paternal Grandmother   . Hyperlipidemia Paternal Grandfather   . Hypertension Paternal Grandfather   . Heart attack Brother      History  Substance Use Topics  . Smoking status: Never Smoker   . Smokeless tobacco: Not on file  . Alcohol Use: No     Objective: Filed Vitals:   01/02/13 1113  BP: 139/90  Pulse: 77    General: Alert and Oriented, No Acute Distress HEENT: Pupils equal, round, reactive to light. Conjunctivae clear.  External ears unremarkable, canals clear with intact TMs with appropriate landmarks.  Middle ear appears open without effusion. Pink inferior turbinates.  Moist mucous membranes, pharynx with mild inflammation no  lesions.  Neck supple without palpable lymphadenopathy nor abnormal masses. Lungs: Clear to auscultation bilaterally, no wheezing/ronchi/rales.  Comfortable work of breathing. Good air movement. Cardiac: Regular rate and rhythm. Normal S1/S2.  No murmurs, rubs, nor gallops.   Extremities: No peripheral edema.  Strong peripheral pulses.  Mental Status: No depression, anxiety, nor agitation. Skin: Warm and dry.  Assessment & Plan: Kelly Reeves was seen today for sore throat.  Diagnoses and associated orders for this visit:  Acute pharyngitis - POCT rapid strep A - predniSONE (DELTASONE) 20 MG tablet; Two tabs at once daily for five days.  Bacterial sinusitis - cefdinir (OMNICEF) 300 MG capsule; Take 1 capsule (300 mg total) by mouth 2 (two) times daily.    Discussed with patient her sore throat is most likely due to viral pharyngitis or allergic postnasal drip causing pharyngitis. Both of these should have some improvement with prednisone moderate dose. Low suspicion for strep throat, strep screen negative, provided with Omnicef should symptoms fail to get better over the holiday weekend  Return if symptoms worsen or fail to improve.

## 2013-01-09 ENCOUNTER — Encounter: Payer: Self-pay | Admitting: Physician Assistant

## 2013-01-09 ENCOUNTER — Ambulatory Visit (INDEPENDENT_AMBULATORY_CARE_PROVIDER_SITE_OTHER): Payer: BC Managed Care – PPO | Admitting: Physician Assistant

## 2013-01-09 VITALS — BP 142/88 | HR 85 | Wt 267.0 lb

## 2013-01-09 DIAGNOSIS — E039 Hypothyroidism, unspecified: Secondary | ICD-10-CM

## 2013-01-09 DIAGNOSIS — R221 Localized swelling, mass and lump, neck: Secondary | ICD-10-CM

## 2013-01-09 DIAGNOSIS — R6889 Other general symptoms and signs: Secondary | ICD-10-CM

## 2013-01-09 DIAGNOSIS — J029 Acute pharyngitis, unspecified: Secondary | ICD-10-CM

## 2013-01-09 MED ORDER — FLUTICASONE PROPIONATE 50 MCG/ACT NA SUSP: 2.0000 | Freq: Every day | NASAL | Status: DC

## 2013-01-09 NOTE — Patient Instructions (Signed)
Put in referral for ENT. Start nasal spray. Gave solumedrol. Will call with thyroid results.

## 2013-01-09 NOTE — Progress Notes (Signed)
  Subjective:    Patient ID: Kelly Reeves. Kelly Reeves, female    DOB: 1965/08/28, 47 y.o.   MRN: [id number]  HPI Patient presents to the clinic with ongoing sore throat that will not resolve. [facility] and given Augmentin and then by Dr. Ivan Anchors and given omnicef with prednisone. She had a reaction to prednisone after first dose and stopped. Her throat continues to hurt and feels like a lump when she swallows. She is still on omnicef. She can eat and drink without difficultly. No fever, chills, muscle aches, sinus pressure, nasal congestion, or ear pain. She has been taking allergy medication. She is concerned because she has a strong family history for thyroid cancer.    Review of Systems     Objective:   Physical Exam  Constitutional: She is oriented to person, place, and time. She appears well-developed and well-nourished.  Obese.  HENT:  Head: Normocephalic and atraumatic.  Right Ear: External ear normal.  Left Ear: External ear normal.  Nose: Nose normal.  Mouth/Throat: Oropharynx is clear and moist. No oropharyngeal exudate.  Eyes: Conjunctivae are normal.  Neck: Normal range of motion. Neck supple. No thyromegaly present.  Cardiovascular: Normal rate, regular rhythm and normal heart sounds.   Pulmonary/Chest: Effort normal and breath sounds normal. She has no wheezes.  Lymphadenopathy:    She has no cervical adenopathy.  Neurological: She is alert and oriented to person, place, and time.  Skin: Skin is warm and dry.  Psychiatric: She has a normal mood and affect. Her behavior is normal.          Assessment & Plan:  Sore throat/lump in throat- I really suspect allergic or post nasal component. Will refer to ENT. Since could not tolerate oral prednisone did give IM solumedrol 125mg  today along with rx for nasal spray to use daily. Also rechecked TSH/T4/T3 to see if out of whack. Reassured patient that thyroid cancer is a big jump but will thoroughly evaluate since  ongoing symptoms and family history.

## 2013-01-10 LAB — T4, FREE: Free T4: 1.58 ng/dL (ref 0.80–1.80)

## 2013-01-10 LAB — TSH: TSH: 0.123 u[IU]/mL — ABNORMAL LOW (ref 0.350–4.500)

## 2013-01-11 ENCOUNTER — Other Ambulatory Visit: Payer: Self-pay | Admitting: Physician Assistant

## 2013-01-11 MED ORDER — OMEPRAZOLE 40 MG PO CPDR: 40.0000 mg | DELAYED_RELEASE_CAPSULE | Freq: Every day | ORAL | Status: DC

## 2013-01-23 ENCOUNTER — Ambulatory Visit: Payer: BC Managed Care – PPO | Admitting: Physician Assistant

## 2013-01-30 ENCOUNTER — Ambulatory Visit: Payer: BC Managed Care – PPO | Admitting: Physician Assistant

## 2013-04-13 ENCOUNTER — Other Ambulatory Visit: Payer: Self-pay | Admitting: Physician Assistant

## 2013-07-24 ENCOUNTER — Encounter: Payer: Self-pay | Admitting: Physician Assistant

## 2013-07-24 ENCOUNTER — Ambulatory Visit (INDEPENDENT_AMBULATORY_CARE_PROVIDER_SITE_OTHER): Payer: BC Managed Care – PPO | Admitting: Physician Assistant

## 2013-07-24 VITALS — BP 150/90 | HR 98 | Wt 271.0 lb

## 2013-07-24 DIAGNOSIS — I10 Essential (primary) hypertension: Secondary | ICD-10-CM

## 2013-07-24 DIAGNOSIS — R51 Headache: Secondary | ICD-10-CM

## 2013-07-24 DIAGNOSIS — Z79899 Other long term (current) drug therapy: Secondary | ICD-10-CM

## 2013-07-24 DIAGNOSIS — R519 Headache, unspecified: Secondary | ICD-10-CM

## 2013-07-24 DIAGNOSIS — Z13220 Encounter for screening for lipoid disorders: Secondary | ICD-10-CM

## 2013-07-24 DIAGNOSIS — R7301 Impaired fasting glucose: Secondary | ICD-10-CM

## 2013-07-24 DIAGNOSIS — E039 Hypothyroidism, unspecified: Secondary | ICD-10-CM

## 2013-07-24 DIAGNOSIS — F411 Generalized anxiety disorder: Secondary | ICD-10-CM

## 2013-07-24 LAB — POCT GLYCOSYLATED HEMOGLOBIN (HGB A1C): HEMOGLOBIN A1C: 5.6

## 2013-07-24 MED ORDER — HYDROCHLOROTHIAZIDE 25 MG PO TABS: 25.0000 mg | ORAL_TABLET | Freq: Every day | ORAL | Status: DC

## 2013-07-24 NOTE — Patient Instructions (Signed)

## 2013-07-24 NOTE — Progress Notes (Signed)
   Subjective:    Patient ID: Kelly Reeves, female    DOB: 23-May-1966, 48 y.o.   MRN: [id number]  HPI Pt presents to the clinic for 6 month recheck.   Hyperlipidemia- needs rechecked.   Hypothyroidism- on levothyroixine daily. Needs rechecked.   Concerned because glucose has been 240 when checked after meal. Also correlates with vision blurrness that comes and goes after lunch. Some dizziness after lunch. Denies any CP, palpitations, or passing out. She is very stressed at work and feels anxious.     Review of Systems     Objective:   Physical Exam  Constitutional: She is oriented to person, place, and time. She appears well-developed and well-nourished.  Obese.   HENT:  Head: Normocephalic and atraumatic.  Eyes: Conjunctivae and EOM are normal. Pupils are equal, round, and reactive to light. Right eye exhibits no discharge. Left eye exhibits no discharge.  Neck: Normal range of motion. Neck supple. No thyromegaly present.  Cardiovascular: Normal rate, regular rhythm and normal heart sounds.   Pulmonary/Chest: Effort normal and breath sounds normal. She has no wheezes.  Neurological: She is alert and oriented to person, place, and time.  Skin: Skin is warm and dry.  Psychiatric: She has a normal mood and affect. Her behavior is normal.          Assessment & Plan:  HTN-BP elevated today. Will add diuretic. Will recheck in 1 month. Discussed diet and exercise. Would like to start phentermine. Discussed BP has to be controlled before starting. Work on making better diet choices. Walk at least 3 times a week.   Hypothyroidism- will recheck today.   Vision blurrness/dizziness- .[name]. Lab Results  Component Value Date   HGBA1C 5.6 07/24/2013  does not seem like glucose but still could be if elevated after meal time. Suggested eye doctor recheck. Could also be BP since elevated today.   anxiety does not want anything for anxiety right now.   Hyperlipidemia- rechecked today.

## 2013-07-25 ENCOUNTER — Other Ambulatory Visit: Payer: Self-pay | Admitting: Physician Assistant

## 2013-07-25 LAB — COMPLETE METABOLIC PANEL WITH GFR
ALT: 30 U/L (ref 0–35)
AST: 30 U/L (ref 0–37)
Albumin: 4.1 g/dL (ref 3.5–5.2)
Alkaline Phosphatase: 82 U/L (ref 39–117)
BUN: 8 mg/dL (ref 6–23)
CALCIUM: 9.1 mg/dL (ref 8.4–10.5)
CHLORIDE: 102 meq/L (ref 96–112)
CO2: 23 meq/L (ref 19–32)
CREATININE: 0.59 mg/dL (ref 0.50–1.10)
GFR, Est Non African American: >89 mL/min
Glucose, Bld: 93 mg/dL (ref 70–99)
Potassium: 4.5 mEq/L (ref 3.5–5.3)
Sodium: 138 mEq/L (ref 135–145)
Total Bilirubin: 0.6 mg/dL (ref 0.3–1.2)
Total Protein: 7.4 g/dL (ref 6.0–8.3)

## 2013-07-25 LAB — LIPID PANEL
CHOLESTEROL: 284 mg/dL — AB (ref 0–200)
HDL: 40 mg/dL (ref >39)
LDL Cholesterol: 188 mg/dL — ABNORMAL HIGH (ref 0–99)
TRIGLYCERIDES: 278 mg/dL — AB (ref <150)
Total CHOL/HDL Ratio: 7.1 Ratio
VLDL: 56 mg/dL — ABNORMAL HIGH (ref 0–40)

## 2013-07-25 LAB — T4, FREE: FREE T4: 1.3 ng/dL (ref 0.80–1.80)

## 2013-07-25 LAB — TSH: TSH: 0.258 u[IU]/mL — AB (ref 0.350–4.500)

## 2013-07-25 MED ORDER — LEVOTHYROXINE SODIUM 100 MCG PO TABS: 100.0000 ug | ORAL_TABLET | Freq: Every day | ORAL | Status: DC

## 2013-07-25 MED ORDER — SIMVASTATIN 20 MG PO TABS: 20.0000 mg | ORAL_TABLET | Freq: Every day | ORAL | Status: DC

## 2013-08-21 ENCOUNTER — Encounter: Payer: Self-pay | Admitting: Physician Assistant

## 2013-08-21 ENCOUNTER — Ambulatory Visit (INDEPENDENT_AMBULATORY_CARE_PROVIDER_SITE_OTHER): Payer: BC Managed Care – PPO | Admitting: Physician Assistant

## 2013-08-21 VITALS — BP 130/80 | HR 96 | Wt 268.0 lb

## 2013-08-21 DIAGNOSIS — B351 Tinea unguium: Secondary | ICD-10-CM | POA: Insufficient documentation

## 2013-08-21 DIAGNOSIS — J069 Acute upper respiratory infection, unspecified: Secondary | ICD-10-CM

## 2013-08-21 DIAGNOSIS — I10 Essential (primary) hypertension: Secondary | ICD-10-CM

## 2013-08-21 MED ORDER — HYDROCHLOROTHIAZIDE 25 MG PO TABS: 25.0000 mg | ORAL_TABLET | Freq: Every day | ORAL | Status: DC

## 2013-08-21 MED ORDER — TERBINAFINE HCL 250 MG PO TABS
250.0000 mg | ORAL_TABLET | Freq: Every day | ORAL | Status: AC
Start: 2013-08-21 — End: 2013-11-13

## 2013-08-21 MED ORDER — PHENTERMINE HCL 37.5 MG PO TABS: 37.5000 mg | ORAL_TABLET | Freq: Every day | ORAL | Status: DC

## 2013-08-21 NOTE — Progress Notes (Signed)
   Subjective:    Patient ID: Kelly Reeves, female    DOB: 1966-02-28, 48 y.o.   MRN: [id number]  HPI Pt is a 48 yo female who presents to the clinic to follow up on hypertension and to talk about weight loss. We started patient on a diuretic one month ago. Patient has tolerated the diuretic well. She denies any chest pains, palpitations, headaches, vision changes. She has not initiated any exercise but has started to make diet changes. She has lost 3 pounds since last visit. She would like to try phentermine at needed her blood pressure to be controlled.  Patient would also like her toenails to be evaluated. The last month they have begin changing to a white color and becoming thicker. She had a pedicure approximately a month and a half ago. These changes started happening after pedicure.  Patient has had cold-like symptoms for the past week and a half. She does seem to be getting significantly better. She still has an occasional dry cough. Her sore throat is improving. She denies any sinus pressure. She denies any fever, chills, nausea or vomiting. She has no wheezing or shortness of breath. She's been trying over-the-counter Mucinex, cough syrup, ibuprofen.   Review of Systems     Objective:   Physical Exam  Constitutional: She is oriented to person, place, and time. She appears well-developed and well-nourished.  obesity  HENT:  Head: Normocephalic and atraumatic.  Right Ear: External ear normal.  Left Ear: External ear normal.  Nose: Nose normal.  Mouth/Throat: Oropharynx is clear and moist.  Eyes: Conjunctivae are normal.  Neck: Normal range of motion. Neck supple.  Anterior bilateral cervical lymph nodes are swollen without any tenderness to palpation.  Cardiovascular: Normal rate, regular rhythm and normal heart sounds.   Pulmonary/Chest: Effort normal and breath sounds normal. She has no wheezes.  Neurological: She is alert and oriented to person, place, and time.  Skin:  Skin is dry.  Toenails on bilateral feet are turning white at the tips as well as showing signs of thickening. On her left foot fourth metatarsal the toe nail has actually taken to where it is detached from the nailbed.  Psychiatric: She has a normal mood and affect. Her behavior is normal.          Assessment & Plan:  Hypertension-continue with HCTZ daily. Refilled for six-month.  Obesity-will start phentermine today. Followup visit in one month. Patient warned of side effects of phentermine with increased heart rate or insomnia. Place, have any adverse effects. Discussed keeping to a 1200-1300-calorie diet and exercising at least 150 minutes a week.  Onychomycosis- treated with Lamisil for 12 weeks. Liver enzymes look great. Discuss with patient beginning signs of fungal disease.    URI-reassured patient that symptoms seem to be resolving as well as no signs today on exam of any infection. Continue doing symptomatic treatment for cough if any symptoms start to worsen.

## 2013-09-02 ENCOUNTER — Ambulatory Visit (INDEPENDENT_AMBULATORY_CARE_PROVIDER_SITE_OTHER): Payer: BC Managed Care – PPO | Admitting: Physician Assistant

## 2013-09-02 ENCOUNTER — Encounter: Payer: Self-pay | Admitting: Physician Assistant

## 2013-09-02 VITALS — BP 137/86 | HR 93 | Wt 266.0 lb

## 2013-09-02 DIAGNOSIS — N390 Urinary tract infection, site not specified: Secondary | ICD-10-CM

## 2013-09-02 DIAGNOSIS — R350 Frequency of micturition: Secondary | ICD-10-CM

## 2013-09-02 LAB — POCT URINALYSIS DIPSTICK
Glucose, UA: NEGATIVE
KETONES UA: NEGATIVE
NITRITE UA: NEGATIVE
PH UA: 5.5
PROTEIN UA: 30
Spec Grav, UA: >=1.03
Urobilinogen, UA: 0.2

## 2013-09-02 MED ORDER — FLUCONAZOLE 150 MG PO TABS: 150.0000 mg | ORAL_TABLET | Freq: Once | ORAL | Status: DC

## 2013-09-02 MED ORDER — CIPROFLOXACIN HCL 500 MG PO TABS: 500.0000 mg | ORAL_TABLET | Freq: Two times a day (BID) | ORAL | Status: DC

## 2013-09-02 NOTE — Progress Notes (Signed)
   Subjective:    Patient ID: Jamison OkaLisa F. Merlene MorseChilders, female    DOB: [birth year], 48 y.o.   MRN: [id number]  HPI Pt presents to the clinic with 4 days of dysuria, urinary urgency and frequency. Nothing done to make better. Symptoms seem to be getting worse. No fever, chills, nausea, vomiting, abdominal pain, or back pain.    Review of Systems     Objective:   Physical Exam  Constitutional: She is oriented to person, place, and time. She appears well-developed and well-nourished.  HENT:  Head: Normocephalic and atraumatic.  Cardiovascular: Normal rate, regular rhythm and normal heart sounds.   Pulmonary/Chest: Effort normal and breath sounds normal. She has no wheezes.  No CVA tenderness.   Abdominal: Soft. Bowel sounds are normal. There is no tenderness.  Neurological: She is alert and oriented to person, place, and time.  Skin: Skin is dry.  Psychiatric: She has a normal mood and affect. Her behavior is normal.          Assessment & Plan:  UTI- UA positive for blood, leuks, protein, bilirubin. Treated with cipro for 3 days and diflucan due to hx of yeast infection after abx usage. Gave HO for symptomatic care. Increase water. Follow up if symptoms persist.

## 2013-09-02 NOTE — Patient Instructions (Signed)
Urinary Tract Infection  Urinary tract infections (UTIs) can develop anywhere along your urinary tract. Your urinary tract is your body's drainage system for removing wastes and extra water. Your urinary tract includes two kidneys, two ureters, a bladder, and a urethra. Your kidneys are a pair of bean-shaped organs. Each kidney is about the size of your fist. They are located below your ribs, one on each side of your spine.  CAUSES  Infections are caused by microbes, which are microscopic organisms, including fungi, viruses, and bacteria. These organisms are so small that they can only be seen through a microscope. Bacteria are the microbes that most commonly cause UTIs.  SYMPTOMS   Symptoms of UTIs may vary by age and gender of the patient and by the location of the infection. Symptoms in young women typically include a frequent and intense urge to urinate and a painful, burning feeling in the bladder or urethra during urination. Older women and men are more likely to be tired, shaky, and weak and have muscle aches and abdominal pain. A fever may mean the infection is in your kidneys. Other symptoms of a kidney infection include pain in your back or sides below the ribs, nausea, and vomiting.  DIAGNOSIS  To diagnose a UTI, your caregiver will ask you about your symptoms. Your caregiver also will ask to provide a urine sample. The urine sample will be tested for bacteria and white blood cells. White blood cells are made by your body to help fight infection.  TREATMENT   Typically, UTIs can be treated with medication. Because most UTIs are caused by a bacterial infection, they usually can be treated with the use of antibiotics. The choice of antibiotic and length of treatment depend on your symptoms and the type of bacteria causing your infection.  HOME CARE INSTRUCTIONS   If you were prescribed antibiotics, take them exactly as your caregiver instructs you. Finish the medication even if you feel better after you  have only taken some of the medication.   Drink enough water and fluids to keep your urine clear or pale yellow.   Avoid caffeine, tea, and carbonated beverages. They tend to irritate your bladder.   Empty your bladder often. Avoid holding urine for long periods of time.   Empty your bladder before and after sexual intercourse.   After a bowel movement, women should cleanse from front to back. Use each tissue only once.  SEEK MEDICAL CARE IF:    You have back pain.   You develop a fever.   Your symptoms do not begin to resolve within 3 days.  SEEK IMMEDIATE MEDICAL CARE IF:    You have severe back pain or lower abdominal pain.   You develop chills.   You have nausea or vomiting.   You have continued burning or discomfort with urination.  MAKE SURE YOU:    Understand these instructions.   Will watch your condition.   Will get help right away if you are not doing well or get worse.  Document Released: 03/30/2005 Document Revised: 12/20/2011 Document Reviewed: 07/29/2011  [employer] Patient Information 2014 [employer]

## 2013-09-18 ENCOUNTER — Ambulatory Visit (INDEPENDENT_AMBULATORY_CARE_PROVIDER_SITE_OTHER): Payer: BC Managed Care – PPO | Admitting: Physician Assistant

## 2013-09-18 ENCOUNTER — Encounter: Payer: Self-pay | Admitting: Physician Assistant

## 2013-09-18 VITALS — BP 128/82 | HR 108 | Wt 258.0 lb

## 2013-09-18 DIAGNOSIS — Z79899 Other long term (current) drug therapy: Secondary | ICD-10-CM

## 2013-09-18 DIAGNOSIS — I10 Essential (primary) hypertension: Secondary | ICD-10-CM

## 2013-09-18 DIAGNOSIS — R635 Abnormal weight gain: Secondary | ICD-10-CM

## 2013-09-18 LAB — BASIC METABOLIC PANEL WITH GFR
BUN: 9 mg/dL (ref 6–23)
CALCIUM: 9.5 mg/dL (ref 8.4–10.5)
CO2: 32 meq/L (ref 19–32)
CREATININE: 0.74 mg/dL (ref 0.50–1.10)
Chloride: 96 mEq/L (ref 96–112)
GFR, Est African American: >89 mL/min
GFR, Est Non African American: >89 mL/min
Glucose, Bld: 140 mg/dL — ABNORMAL HIGH (ref 70–99)
Potassium: 3.6 mEq/L (ref 3.5–5.3)
Sodium: 137 mEq/L (ref 135–145)

## 2013-09-18 MED ORDER — PHENTERMINE HCL 37.5 MG PO TABS: 37.5000 mg | ORAL_TABLET | Freq: Every day | ORAL | Status: DC

## 2013-09-18 NOTE — Progress Notes (Signed)
   Subjective:    Patient ID: Kelly Reeves, female    DOB: 08/28/1965, 48 y.o.   MRN: [id number]  HPI Pt following up on starting phentermine. She has been on it one full month and down 9lbs. Total for 23lbs of weight loss since January. She is drinking more water and exercising 3 times a week. She denies any palpitations, insomnia, CP, dizziness. She has been a lot more thristy. She has not been counting her calories but eating 4 -5 small meals a day.    Review of Systems     Objective:   Physical Exam  Constitutional: She is oriented to person, place, and time. She appears well-developed and well-nourished.  Obese.   HENT:  Head: Normocephalic and atraumatic.  Cardiovascular: Regular rhythm and normal heart sounds.   Rechecked from original triage number. Decrease to 99.  Pulmonary/Chest: Effort normal and breath sounds normal. She has no wheezes.  Neurological: She is alert and oriented to person, place, and time.  Skin: Skin is dry.  Psychiatric: She has a normal mood and affect. Her behavior is normal.          Assessment & Plan:  Abnormal weight gain- Losing weight recheck BP and much better. Will continue on phentermine for another month. Will closely monitor bP and pulse rate. Continue to work on calorie counting, drinking water, and increasing exercise. Follow up in one month.   HTN/medication management- will check electrolytes since on HCTZ.

## 2013-10-14 ENCOUNTER — Other Ambulatory Visit: Payer: Self-pay

## 2013-10-14 DIAGNOSIS — Z1231 Encounter for screening mammogram for malignant neoplasm of breast: Secondary | ICD-10-CM

## 2013-10-16 ENCOUNTER — Ambulatory Visit: Payer: BC Managed Care – PPO

## 2013-10-23 ENCOUNTER — Encounter (INDEPENDENT_AMBULATORY_CARE_PROVIDER_SITE_OTHER): Payer: Self-pay

## 2013-10-23 ENCOUNTER — Ambulatory Visit
Admission: RE | Admit: 2013-10-23 | Discharge: 2013-10-23 | Disposition: A | Discharge status: Home / Self Care | Payer: Self-pay | Source: Ambulatory Visit

## 2013-10-23 DIAGNOSIS — Z1231 Encounter for screening mammogram for malignant neoplasm of breast: Secondary | ICD-10-CM

## 2013-11-13 ENCOUNTER — Ambulatory Visit (INDEPENDENT_AMBULATORY_CARE_PROVIDER_SITE_OTHER): Payer: BC Managed Care – PPO | Admitting: Physician Assistant

## 2013-11-13 ENCOUNTER — Encounter: Payer: Self-pay | Admitting: *Deleted

## 2013-11-13 VITALS — BP 129/87 | HR 115 | Wt 252.0 lb

## 2013-11-13 DIAGNOSIS — R635 Abnormal weight gain: Secondary | ICD-10-CM

## 2013-11-13 DIAGNOSIS — E039 Hypothyroidism, unspecified: Secondary | ICD-10-CM

## 2013-11-13 NOTE — Progress Notes (Signed)
   Subjective:    Patient ID: Kelly Reeves, female    DOB: 13-Apr-1966, 48 y.o.   MRN: [id number]  HPI   Patient doing well on appetite suppressant.  Here for nurse visit, weight, BP, HR check.  Denies problems with insomnia, heart palpitations or tremors.  Satisfied with weight loss thus far and is working on [employer] diet and regular exercise.    Review of Systems     Objective:   Physical Exam        Assessment & Plan:  Pt asked for a refill on thyroid medication. I told her that we would have to check to see if she was due for labs and call her. Pt is due for TSH per last lab result note. Tried to call pt but vm has not been set up on cell  HR is up today. I would like to decrease suppressant to 30mg  and recheck HR in 1 month. Tandy GawJade Breeback PAC please call if become symptomatic. Lost 6lbs.

## 2013-11-14 ENCOUNTER — Telehealth: Payer: Self-pay | Admitting: *Deleted

## 2013-11-14 NOTE — Telephone Encounter (Signed)
Kelly RubensteinJade I sent you I nurse visit I did yesterday on this pt. I forgot to send it to you yesterday

## 2013-11-15 ENCOUNTER — Other Ambulatory Visit: Payer: Self-pay | Admitting: Physician Assistant

## 2013-11-15 MED ORDER — PHENTERMINE HCL 30 MG PO CAPS: 30.0000 mg | ORAL_CAPSULE | ORAL | Status: DC

## 2013-11-15 NOTE — Progress Notes (Signed)
Left message on home #

## 2013-11-15 NOTE — Telephone Encounter (Signed)
Pt notified via vm earlier this am

## 2013-11-15 NOTE — Telephone Encounter (Signed)
i did decrease phentermine to 30 due to HR elevation. Recheck in 1 month.

## 2013-12-18 ENCOUNTER — Ambulatory Visit (INDEPENDENT_AMBULATORY_CARE_PROVIDER_SITE_OTHER): Payer: BC Managed Care – PPO | Admitting: Physician Assistant

## 2013-12-18 ENCOUNTER — Encounter: Payer: Self-pay | Admitting: Physician Assistant

## 2013-12-18 VITALS — BP 122/80 | HR 92 | Ht 64.0 in | Wt 253.0 lb

## 2013-12-18 DIAGNOSIS — R635 Abnormal weight gain: Secondary | ICD-10-CM

## 2013-12-18 DIAGNOSIS — E669 Obesity, unspecified: Secondary | ICD-10-CM

## 2013-12-18 DIAGNOSIS — E039 Hypothyroidism, unspecified: Secondary | ICD-10-CM

## 2013-12-18 MED ORDER — PHENTERMINE HCL 30 MG PO CAPS: 30.0000 mg | ORAL_CAPSULE | ORAL | Status: DC

## 2013-12-18 NOTE — Progress Notes (Signed)
   Subjective:    Patient ID: Kelly Reeves, female    DOB: 24-Sep-1965, 48 y.o.   MRN: [id number]  HPI Patient is a 48 year old female who presents to clinic with  followup on phentermine. She's been on medication for 2 months. She originally did drop 6 pounds but has gained 1 pound. She admits to not taking every day. She admits to not only she said with diet and exercise. She just feels like it's controlling her appetite a little bit but not giving her the motivation and once did. She denies any insomnia, palpitations, dry mouth, sweating or any adverse side effects. She would like to continue on for more month. She is walking a couple days a week. She is back on her calories.  Hypothyroidism-needs medication refill. We did have a medication change at last visit deformity to recheck levels today.   Review of Systems  All other systems reviewed and are negative.      Objective:   Physical Exam  Constitutional: She is oriented to person, place, and time. She appears well-developed and well-nourished.  Obesity  HENT:  Head: Normocephalic and atraumatic.  Cardiovascular: Normal rate, regular rhythm and normal heart sounds.   Pulmonary/Chest: Effort normal and breath sounds normal.  Neurological: She is alert and oriented to person, place, and time.  Skin: Skin is dry.  Psychiatric: She has a normal mood and affect. Her behavior is normal.          Assessment & Plan:  Severe Obesity/abnormal weight gain- gained one lb this week. Refilled phentermine. BP much better when rechecked. Pulse fine. Discuss with patient that this is the last month we will begin on phentermine if she does not show some weight loss. There is no need to continue on this medication if not beneficial. Encouraged patient to take daily and work on diet and exercise. Discussed ways to cut calories and ways to increase exercise. Patient aware that medications only as effective as when she puts behind it. Followup  nurses at 1 month.  Hypothyroidism-we'll check levels today and adjust medication accordingly.

## 2013-12-19 LAB — TSH: TSH: 0.516 u[IU]/mL (ref 0.350–4.500)

## 2013-12-19 LAB — T4, FREE: Free T4: 1.19 ng/dL (ref 0.80–1.80)

## 2013-12-20 ENCOUNTER — Other Ambulatory Visit: Payer: Self-pay | Admitting: *Deleted

## 2013-12-20 MED ORDER — LEVOTHYROXINE SODIUM 100 MCG PO TABS: ORAL_TABLET | ORAL | Status: DC

## 2014-01-15 ENCOUNTER — Ambulatory Visit: Payer: BC Managed Care – PPO

## 2014-04-09 ENCOUNTER — Other Ambulatory Visit: Payer: Self-pay | Admitting: Physician Assistant

## 2014-04-27 ENCOUNTER — Other Ambulatory Visit: Payer: Self-pay | Admitting: Physician Assistant

## 2014-06-13 ENCOUNTER — Ambulatory Visit (INDEPENDENT_AMBULATORY_CARE_PROVIDER_SITE_OTHER): Payer: BC Managed Care – PPO | Admitting: Physician Assistant

## 2014-06-13 ENCOUNTER — Encounter: Payer: Self-pay | Admitting: Physician Assistant

## 2014-06-13 ENCOUNTER — Ambulatory Visit: Payer: BC Managed Care – PPO

## 2014-06-13 VITALS — BP 144/90 | HR 97 | Ht 64.0 in | Wt 258.0 lb

## 2014-06-13 DIAGNOSIS — T148XXA Other injury of unspecified body region, initial encounter: Secondary | ICD-10-CM

## 2014-06-13 DIAGNOSIS — M79661 Pain in right lower leg: Secondary | ICD-10-CM

## 2014-06-13 DIAGNOSIS — M24271 Disorder of ligament, right ankle: Secondary | ICD-10-CM

## 2014-06-13 DIAGNOSIS — T148 Other injury of unspecified body region: Secondary | ICD-10-CM | POA: Diagnosis not present

## 2014-06-14 LAB — CBC WITH DIFFERENTIAL/PLATELET
Basophils Absolute: 0.1 10*3/uL (ref 0.0–0.1)
Basophils Relative: 1 % (ref 0–1)
EOS PCT: 2 % (ref 0–5)
Eosinophils Absolute: 0.2 10*3/uL (ref 0.0–0.7)
HCT: 40.9 % (ref 36.0–46.0)
HEMOGLOBIN: 14.1 g/dL (ref 12.0–15.0)
Lymphocytes Relative: 35 % (ref 12–46)
Lymphs Abs: 2.8 10*3/uL (ref 0.7–4.0)
MCH: 29.7 pg (ref 26.0–34.0)
MCHC: 34.5 g/dL (ref 30.0–36.0)
MCV: 86.3 fL (ref 78.0–100.0)
MPV: 9.1 fL — ABNORMAL LOW (ref 9.4–12.4)
Monocytes Absolute: 0.5 10*3/uL (ref 0.1–1.0)
Monocytes Relative: 6 % (ref 3–12)
Neutro Abs: 4.4 10*3/uL (ref 1.7–7.7)
Neutrophils Relative %: 56 % (ref 43–77)
Platelets: 501 10*3/uL — ABNORMAL HIGH (ref 150–400)
RBC: 4.74 MIL/uL (ref 3.87–5.11)
RDW: 14.3 % (ref 11.5–15.5)
WBC: 7.9 10*3/uL (ref 4.0–10.5)

## 2014-06-14 LAB — COMPLETE METABOLIC PANEL WITH GFR
ALT: 28 U/L (ref 0–35)
AST: 30 U/L (ref 0–37)
Albumin: 4.3 g/dL (ref 3.5–5.2)
Alkaline Phosphatase: 88 U/L (ref 39–117)
BUN: 9 mg/dL (ref 6–23)
CO2: 32 mEq/L (ref 19–32)
CREATININE: 0.7 mg/dL (ref 0.50–1.10)
Calcium: 9.9 mg/dL (ref 8.4–10.5)
Chloride: 97 mEq/L (ref 96–112)
GFR, Est Non African American: >89 mL/min
Glucose, Bld: 99 mg/dL (ref 70–99)
Potassium: 4 mEq/L (ref 3.5–5.3)
Sodium: 137 mEq/L (ref 135–145)
Total Bilirubin: 0.4 mg/dL (ref 0.2–1.2)
Total Protein: 7.7 g/dL (ref 6.0–8.3)

## 2014-06-14 LAB — MAGNESIUM: MAGNESIUM: 1.9 mg/dL (ref 1.5–2.5)

## 2014-06-15 NOTE — Progress Notes (Signed)
   Subjective:    Patient ID: Kelly Reeves, female    DOB: Jan 21, 1966, 48 y.o.   MRN: [id number]  HPI Pt presents to the clinic with right calf pain and bruise for last couple of months. Does not remember any trauma. Right ankle also started to give way with every day walking. Not tried anything to make better. No SOB or weakness.    Review of Systems  All other systems reviewed and are negative.      Objective:   Physical Exam  Constitutional: She is oriented to person, place, and time. She appears well-developed and well-nourished.  HENT:  Head: Normocephalic and atraumatic.  Cardiovascular: Normal rate, regular rhythm and normal heart sounds.   Pulmonary/Chest: Effort normal and breath sounds normal. She has no wheezes.  Musculoskeletal:  No tenderness over ankle to palpation.  No swelling over right ankle.  ROM normal.  No laxity on exam.   Neurological: She is oriented to person, place, and time.  Skin:  Right calf bruise. tendernes to palpation over right calf. No warmth, swelling or redness.  Negative homans sign.   Psychiatric: She has a normal mood and affect. Her behavior is normal.          Assessment & Plan:  Right calf pain/bruise- low suspcion for DVT. Will get venous doppler. STAT was negative for DVT. Will check electrolytes. Suggested ibuprofen for next few days with calf exercises. Follow up as needed.   Right ankle laxity- on exam seems stable. Discussed could have sprained and needs to build back strength. Wear good supportive shoes and ace up ankle for stabliation if walking or active for long distances.

## 2014-10-08 ENCOUNTER — Other Ambulatory Visit: Payer: Self-pay

## 2014-10-08 DIAGNOSIS — Z1231 Encounter for screening mammogram for malignant neoplasm of breast: Secondary | ICD-10-CM

## 2014-10-15 ENCOUNTER — Encounter: Payer: Self-pay | Admitting: Physician Assistant

## 2014-10-15 ENCOUNTER — Ambulatory Visit (INDEPENDENT_AMBULATORY_CARE_PROVIDER_SITE_OTHER): Payer: BLUE CROSS/BLUE SHIELD | Admitting: Physician Assistant

## 2014-10-15 VITALS — BP 122/82 | HR 101 | Wt 263.0 lb

## 2014-10-15 DIAGNOSIS — E785 Hyperlipidemia, unspecified: Secondary | ICD-10-CM

## 2014-10-15 DIAGNOSIS — E039 Hypothyroidism, unspecified: Secondary | ICD-10-CM | POA: Diagnosis not present

## 2014-10-15 DIAGNOSIS — E669 Obesity, unspecified: Secondary | ICD-10-CM | POA: Diagnosis not present

## 2014-10-15 DIAGNOSIS — R635 Abnormal weight gain: Secondary | ICD-10-CM

## 2014-10-15 MED ORDER — SIMVASTATIN 20 MG PO TABS: 20.0000 mg | ORAL_TABLET | Freq: Every day | ORAL | Status: DC

## 2014-10-15 NOTE — Patient Instructions (Signed)
saxenda- .6 for one week, 1.2 for one week, 1.8 daily.

## 2014-10-16 ENCOUNTER — Other Ambulatory Visit: Payer: Self-pay | Admitting: *Deleted

## 2014-10-16 DIAGNOSIS — Z6841 Body Mass Index (BMI) 40.0 and over, adult: Secondary | ICD-10-CM | POA: Insufficient documentation

## 2014-10-16 DIAGNOSIS — E039 Hypothyroidism, unspecified: Secondary | ICD-10-CM | POA: Insufficient documentation

## 2014-10-16 DIAGNOSIS — E669 Obesity, unspecified: Secondary | ICD-10-CM | POA: Insufficient documentation

## 2014-10-16 DIAGNOSIS — R635 Abnormal weight gain: Secondary | ICD-10-CM | POA: Insufficient documentation

## 2014-10-16 LAB — TSH: TSH: 0.908 u[IU]/mL (ref 0.350–4.500)

## 2014-10-16 MED ORDER — LEVOTHYROXINE SODIUM 100 MCG PO TABS: ORAL_TABLET | ORAL | Status: DC

## 2014-10-16 MED ORDER — LIRAGLUTIDE -WEIGHT MANAGEMENT 18 MG/3ML ~~LOC~~ SOPN: 1.8000 mg | PEN_INJECTOR | SUBCUTANEOUS | Status: DC

## 2014-10-16 NOTE — Progress Notes (Signed)
   Subjective:    Patient ID: Kelly Reeves, female    DOB: 07-23-1965, 49 y.o.   MRN: [id number]  HPI Pt presents to the clinic to get medication refill.   Hypothyroidism- taking medication regularly. No problems.   Would like to discuss weight. Really wants to lose weight. Tried phentermine and did not see significant weight loss. beliviq she did not tolerate. Wants to try something else with diet and exercise.    Review of Systems  All other systems reviewed and are negative.      Objective:   Physical Exam  Constitutional: She is oriented to person, place, and time. She appears well-developed and well-nourished.  Obese.   HENT:  Head: Normocephalic and atraumatic.  Cardiovascular: Normal rate, regular rhythm and normal heart sounds.   Pulmonary/Chest: Effort normal and breath sounds normal.  Neurological: She is alert and oriented to person, place, and time.  Psychiatric: She has a normal mood and affect. Her behavior is normal.          Assessment & Plan:  Hypothyroidism- will check TSH and adjust accordingly.   Obesity/abnormal weight gain- discussed importance of diet and exercise. I think saxenda might be a good choice. One sample left. Discussed how to use and side effects. Will try for 1 month. If like and tolerates will send to pharmacy.   Elevated BP- went down each time checked in office. Looked good right before left.    Hyperlipidemia- noticed pt was not taking zocor. LDL very elevated from previous lab. Pt went off went thought leg cramp could be from statin. She never tried to go back on it. Discussed with patient to slowly start taking again and get up to at least 3 times a week and see if any muscle cramps or side effects occur. If not will check LDL. If not able to tolerate will consider livalo since failed 2 statins.

## 2014-10-21 ENCOUNTER — Telehealth: Payer: Self-pay | Admitting: Physician Assistant

## 2014-10-21 NOTE — Telephone Encounter (Signed)
Received fax for pa on saxenda sent through [facility] and the medication is approved - CF

## 2014-10-29 ENCOUNTER — Ambulatory Visit
Admission: RE | Admit: 2014-10-29 | Discharge: 2014-10-29 | Disposition: A | Discharge status: Home / Self Care | Payer: BLUE CROSS/BLUE SHIELD | Source: Ambulatory Visit

## 2014-10-29 DIAGNOSIS — Z1231 Encounter for screening mammogram for malignant neoplasm of breast: Secondary | ICD-10-CM

## 2014-11-03 ENCOUNTER — Other Ambulatory Visit: Payer: Self-pay | Admitting: Obstetrics and Gynecology

## 2014-11-03 DIAGNOSIS — N644 Mastodynia: Secondary | ICD-10-CM

## 2014-11-12 ENCOUNTER — Ambulatory Visit
Admission: RE | Admit: 2014-11-12 | Discharge: 2014-11-12 | Disposition: A | Discharge status: Home / Self Care | Payer: BLUE CROSS/BLUE SHIELD | Source: Ambulatory Visit | Attending: Obstetrics and Gynecology | Admitting: Obstetrics and Gynecology

## 2014-11-12 DIAGNOSIS — N644 Mastodynia: Secondary | ICD-10-CM

## 2015-02-09 ENCOUNTER — Other Ambulatory Visit: Payer: Self-pay | Admitting: Physician Assistant

## 2015-02-27 ENCOUNTER — Encounter: Payer: Self-pay | Admitting: Physician Assistant

## 2015-02-27 ENCOUNTER — Ambulatory Visit (INDEPENDENT_AMBULATORY_CARE_PROVIDER_SITE_OTHER): Payer: BLUE CROSS/BLUE SHIELD | Admitting: Physician Assistant

## 2015-02-27 VITALS — BP 140/88 | HR 97 | Ht 64.0 in | Wt 263.0 lb

## 2015-02-27 DIAGNOSIS — Z23 Encounter for immunization: Secondary | ICD-10-CM | POA: Diagnosis not present

## 2015-02-27 DIAGNOSIS — Z114 Encounter for screening for human immunodeficiency virus [HIV]: Secondary | ICD-10-CM

## 2015-02-27 DIAGNOSIS — E669 Obesity, unspecified: Secondary | ICD-10-CM

## 2015-02-27 DIAGNOSIS — E039 Hypothyroidism, unspecified: Secondary | ICD-10-CM | POA: Diagnosis not present

## 2015-02-27 DIAGNOSIS — Z131 Encounter for screening for diabetes mellitus: Secondary | ICD-10-CM

## 2015-02-27 DIAGNOSIS — Z13220 Encounter for screening for lipoid disorders: Secondary | ICD-10-CM

## 2015-02-27 LAB — COMPLETE METABOLIC PANEL WITH GFR
ALT: 35 U/L — ABNORMAL HIGH (ref 6–29)
AST: 46 U/L — ABNORMAL HIGH (ref 10–35)
Albumin: 4.2 g/dL (ref 3.6–5.1)
Alkaline Phosphatase: 95 U/L (ref 33–115)
BILIRUBIN TOTAL: 0.8 mg/dL (ref 0.2–1.2)
BUN: 12 mg/dL (ref 7–25)
CHLORIDE: 97 mmol/L — AB (ref 98–110)
CO2: 27 mmol/L (ref 20–31)
Calcium: 9.2 mg/dL (ref 8.6–10.2)
Creat: 0.76 mg/dL (ref 0.50–1.10)
GLUCOSE: 108 mg/dL — AB (ref 65–99)
Potassium: 4 mmol/L (ref 3.5–5.3)
SODIUM: 138 mmol/L (ref 135–146)
Total Protein: 7.5 g/dL (ref 6.1–8.1)

## 2015-02-27 LAB — LIPID PANEL
Cholesterol: 326 mg/dL — ABNORMAL HIGH (ref 125–200)
HDL: 33 mg/dL — AB (ref >=46)
LDL CALC: 237 mg/dL — AB (ref <130)
Total CHOL/HDL Ratio: 9.9 Ratio — ABNORMAL HIGH (ref <=5.0)
Triglycerides: 281 mg/dL — ABNORMAL HIGH (ref <150)
VLDL: 56 mg/dL — AB (ref <30)

## 2015-02-27 LAB — TSH: TSH: 1.924 u[IU]/mL (ref 0.350–4.500)

## 2015-02-27 MED ORDER — PHENTERMINE HCL 37.5 MG PO TABS: 37.5000 mg | ORAL_TABLET | Freq: Every day | ORAL | Status: DC

## 2015-02-27 NOTE — Patient Instructions (Signed)
contrave topamax Wellbutrin XL

## 2015-02-27 NOTE — Progress Notes (Signed)
   Subjective:    Patient ID: Kelly Reeves, female    DOB: [birth year], 49 y.o.   MRN: [id number]  HPI Pt presents to have labs drawn and get refills.   Hypothyroidism- will check TSH.   Hyperlipidemia- check lipid on zocor.   HTN- no CP, palpitations, HA's, SOB. Continue on HCTZ. Checks at home and under 140/90.   She really wants to lose weight. Did not start saxenda due to fam hx of thyroid cancer. Phentermine tried in past and did well. Would like to try again. Not exercising. Not eating a lot but usually has one big meal a day.    Review of Systems  All other systems reviewed and are negative.      Objective:   Physical Exam  Constitutional: She is oriented to person, place, and time. She appears well-developed and well-nourished.  Obese.   HENT:  Head: Normocephalic and atraumatic.  Cardiovascular: Normal rate, regular rhythm and normal heart sounds.   Pulmonary/Chest: Effort normal and breath sounds normal.  Neurological: She is alert and oriented to person, place, and time.  Psychiatric: She has a normal mood and affect. Her behavior is normal.          Assessment & Plan:  Obesity- took saxenda off list due to family hx of thyroid cancer. discussed options with contrave or adding topamax to phentermine or just adding wellbutrin. Will start with phentermine due to past success. Recheck in 1 month with nurse visit. Discussed small meals and 1200 to 1500 calorie diet. Pt aware of phentermine side effects.   Hypothyroidism- will recheck TSH and adjust accordingly.   Hyperlipidemia- will check while on zocor.   HTN- borderline today. Per pt checks and normal at home. Continue on HCTZ.

## 2015-02-28 LAB — HIV ANTIBODY (ROUTINE TESTING W REFLEX): HIV 1&2 Ab, 4th Generation: NONREACTIVE

## 2015-03-03 ENCOUNTER — Other Ambulatory Visit: Payer: Self-pay | Admitting: *Deleted

## 2015-03-03 MED ORDER — PITAVASTATIN CALCIUM 2 MG PO TABS: 2.0000 mg | ORAL_TABLET | Freq: Every day | ORAL | Status: DC

## 2015-03-05 ENCOUNTER — Telehealth: Payer: Self-pay

## 2015-03-05 DIAGNOSIS — R7301 Impaired fasting glucose: Secondary | ICD-10-CM

## 2015-03-05 NOTE — Telephone Encounter (Signed)
HgBA1C was not added to labs due to no lavender was drawn.

## 2015-03-05 NOTE — Telephone Encounter (Signed)
Can we see if she can have drawn in lab sometime this week or next.

## 2015-03-06 ENCOUNTER — Telehealth: Payer: Self-pay | Admitting: Physician Assistant

## 2015-03-06 NOTE — Telephone Encounter (Signed)
Received fax from pharmacy for prior authorization on Livalo sent through cover my meds and medication is approved from 03/03/2015 - 03/05/2016 case id [id number]. I notified the pharmacy - CF

## 2015-03-06 NOTE — Telephone Encounter (Signed)
Spoke with pt & she will go to the lab today.  A1c ordered & faxed to [facility].

## 2015-03-18 ENCOUNTER — Telehealth: Payer: Self-pay | Admitting: Physician Assistant

## 2015-03-18 NOTE — Telephone Encounter (Signed)
Received fax for prior authorization on phentermine sent through cover my meds and received approval from 02/16/2015 - 06/16/2015. Case id [id number]. - CF

## 2015-03-25 ENCOUNTER — Ambulatory Visit: Payer: BLUE CROSS/BLUE SHIELD

## 2015-04-01 ENCOUNTER — Ambulatory Visit: Payer: BLUE CROSS/BLUE SHIELD

## 2015-07-15 ENCOUNTER — Ambulatory Visit (INDEPENDENT_AMBULATORY_CARE_PROVIDER_SITE_OTHER): Payer: BLUE CROSS/BLUE SHIELD | Admitting: Physician Assistant

## 2015-07-15 ENCOUNTER — Encounter: Payer: Self-pay | Admitting: Physician Assistant

## 2015-07-15 VITALS — BP 125/77 | HR 93 | Ht 64.0 in

## 2015-07-15 DIAGNOSIS — R21 Rash and other nonspecific skin eruption: Secondary | ICD-10-CM

## 2015-07-15 MED ORDER — HYDROXYZINE HCL 25 MG PO TABS: 25.0000 mg | ORAL_TABLET | Freq: Three times a day (TID) | ORAL | Status: DC | PRN

## 2015-07-15 MED ORDER — METHYLPREDNISOLONE SODIUM SUCC 125 MG IJ SOLR
125.0000 mg | Freq: Once | INTRAMUSCULAR | Status: AC
  Administered 2015-07-15: 125 mg via INTRAMUSCULAR

## 2015-07-15 MED ORDER — LORAZEPAM 0.5 MG PO TABS: 0.5000 mg | ORAL_TABLET | Freq: Three times a day (TID) | ORAL | Status: DC

## 2015-07-15 NOTE — Progress Notes (Signed)
   Subjective:    Patient ID: Kelly Reeves, female    DOB: [birth year], 50 y.o.   MRN: [id number]  HPI  Pt is a 50 yo female who presents to the clinic with red itchy rash over chest, neck and face. It is very itchy. No SOB, lip swelling, problems breathing. Started 11 am this morning after a interaction with patient that didn't know her dad had died and she started talking about him. Started around neck and then spread. She continues to scratch it. This has happened once before 2 days before christmas. She took loads of benadryl and hydrocortisone cream then resolved on its on. Has used a new face cream but only on her face and used for last 2 months. She has not eaten anything different. Unaware of anything she has come in contact with to start rash.      Review of Systems  All other systems reviewed and are negative.      Objective:   Physical Exam  Constitutional: She is oriented to person, place, and time. She appears well-developed and well-nourished.  HENT:  Head: Normocephalic and atraumatic.  Uvula midline no oral swelling.   Neck: Normal range of motion. Neck supple.  Cardiovascular: Normal rate, regular rhythm and normal heart sounds.   Pulmonary/Chest: Effort normal and breath sounds normal.  Lymphadenopathy:    She has no cervical adenopathy.  Neurological: She is alert and oriented to person, place, and time.  Skin: Skin is dry.     Erythematous dry macular rash with raised patches over chest, neck and face.   Psychiatric: She has a normal mood and affect. Her behavior is normal.          Assessment & Plan:  Rash- unclear etiology. Could be an allergic or a question of anxiety trigger. Solumedrol 125mg  IM given in office today. Ativan at onset of next episode to see if helps. Atarax given for itch. Pt has had adverse hx with oral prednisone in past and will stick with shot at this point. Follow up as needed. Discussed red flags symptoms of allergic reaction.

## 2015-07-16 LAB — CBC WITH DIFFERENTIAL/PLATELET
Basophils Absolute: 0.1 10*3/uL (ref 0.0–0.1)
Basophils Relative: 1 % (ref 0–1)
EOS PCT: 2 % (ref 0–5)
Eosinophils Absolute: 0.2 10*3/uL (ref 0.0–0.7)
HCT: 42.6 % (ref 36.0–46.0)
Hemoglobin: 14.2 g/dL (ref 12.0–15.0)
Lymphocytes Relative: 28 % (ref 12–46)
Lymphs Abs: 2.7 10*3/uL (ref 0.7–4.0)
MCH: 29.2 pg (ref 26.0–34.0)
MCHC: 33.3 g/dL (ref 30.0–36.0)
MCV: 87.7 fL (ref 78.0–100.0)
MPV: 9.5 fL (ref 8.6–12.4)
Monocytes Absolute: 0.6 10*3/uL (ref 0.1–1.0)
Monocytes Relative: 6 % (ref 3–12)
Neutro Abs: 6 10*3/uL (ref 1.7–7.7)
Neutrophils Relative %: 63 % (ref 43–77)
Platelets: 505 10*3/uL — ABNORMAL HIGH (ref 150–400)
RBC: 4.86 MIL/uL (ref 3.87–5.11)
RDW: 14 % (ref 11.5–15.5)
WBC: 9.5 10*3/uL (ref 4.0–10.5)

## 2015-07-25 ENCOUNTER — Other Ambulatory Visit: Payer: Self-pay | Admitting: Physician Assistant

## 2015-09-21 ENCOUNTER — Other Ambulatory Visit: Payer: Self-pay | Admitting: Physician Assistant

## 2015-10-07 ENCOUNTER — Other Ambulatory Visit: Payer: Self-pay

## 2015-10-07 DIAGNOSIS — Z1231 Encounter for screening mammogram for malignant neoplasm of breast: Secondary | ICD-10-CM

## 2015-11-13 ENCOUNTER — Ambulatory Visit
Admission: RE | Admit: 2015-11-13 | Discharge: 2015-11-13 | Disposition: A | Discharge status: Home / Self Care | Payer: BLUE CROSS/BLUE SHIELD | Source: Ambulatory Visit

## 2015-11-13 DIAGNOSIS — Z1231 Encounter for screening mammogram for malignant neoplasm of breast: Secondary | ICD-10-CM

## 2015-12-16 ENCOUNTER — Ambulatory Visit (INDEPENDENT_AMBULATORY_CARE_PROVIDER_SITE_OTHER): Payer: BLUE CROSS/BLUE SHIELD

## 2015-12-16 ENCOUNTER — Encounter: Payer: Self-pay | Admitting: Physician Assistant

## 2015-12-16 ENCOUNTER — Other Ambulatory Visit: Payer: Self-pay | Admitting: Physician Assistant

## 2015-12-16 ENCOUNTER — Ambulatory Visit (INDEPENDENT_AMBULATORY_CARE_PROVIDER_SITE_OTHER): Payer: BLUE CROSS/BLUE SHIELD | Admitting: Physician Assistant

## 2015-12-16 DIAGNOSIS — N390 Urinary tract infection, site not specified: Secondary | ICD-10-CM

## 2015-12-16 DIAGNOSIS — Z79899 Other long term (current) drug therapy: Secondary | ICD-10-CM

## 2015-12-16 DIAGNOSIS — R8290 Unspecified abnormal findings in urine: Secondary | ICD-10-CM

## 2015-12-16 DIAGNOSIS — M25571 Pain in right ankle and joints of right foot: Secondary | ICD-10-CM | POA: Diagnosis not present

## 2015-12-16 DIAGNOSIS — M25471 Effusion, right ankle: Secondary | ICD-10-CM | POA: Diagnosis not present

## 2015-12-16 DIAGNOSIS — R7301 Impaired fasting glucose: Secondary | ICD-10-CM

## 2015-12-16 DIAGNOSIS — Z131 Encounter for screening for diabetes mellitus: Secondary | ICD-10-CM

## 2015-12-16 DIAGNOSIS — E039 Hypothyroidism, unspecified: Secondary | ICD-10-CM

## 2015-12-16 DIAGNOSIS — R748 Abnormal levels of other serum enzymes: Secondary | ICD-10-CM

## 2015-12-16 DIAGNOSIS — F32A Depression, unspecified: Secondary | ICD-10-CM

## 2015-12-16 DIAGNOSIS — R29898 Other symptoms and signs involving the musculoskeletal system: Secondary | ICD-10-CM

## 2015-12-16 DIAGNOSIS — E785 Hyperlipidemia, unspecified: Secondary | ICD-10-CM

## 2015-12-16 DIAGNOSIS — F329 Major depressive disorder, single episode, unspecified: Secondary | ICD-10-CM

## 2015-12-16 DIAGNOSIS — R82998 Other abnormal findings in urine: Secondary | ICD-10-CM

## 2015-12-16 LAB — POCT URINALYSIS DIPSTICK
BILIRUBIN UA: NEGATIVE
GLUCOSE UA: NEGATIVE
KETONES UA: NEGATIVE
Nitrite, UA: POSITIVE
Protein, UA: NEGATIVE
RBC UA: NEGATIVE
SPEC GRAV UA: 1.02
UROBILINOGEN UA: 0.2
pH, UA: 6.5

## 2015-12-16 LAB — COMPLETE METABOLIC PANEL WITH GFR
ALT: 31 U/L — AB (ref 6–29)
AST: 40 U/L — AB (ref 10–35)
Albumin: 4.2 g/dL (ref 3.6–5.1)
Alkaline Phosphatase: 79 U/L (ref 33–115)
BUN: 10 mg/dL (ref 7–25)
CALCIUM: 9.6 mg/dL (ref 8.6–10.2)
CHLORIDE: 100 mmol/L (ref 98–110)
CO2: 27 mmol/L (ref 20–31)
CREATININE: 0.73 mg/dL (ref 0.50–1.10)
GFR, Est African American: >89 mL/min (ref >=60)
GFR, Est Non African American: >89 mL/min (ref >=60)
Glucose, Bld: 103 mg/dL — ABNORMAL HIGH (ref 65–99)
POTASSIUM: 3.8 mmol/L (ref 3.5–5.3)
SODIUM: 136 mmol/L (ref 135–146)
Total Bilirubin: 0.6 mg/dL (ref 0.2–1.2)
Total Protein: 7.8 g/dL (ref 6.1–8.1)

## 2015-12-16 LAB — LIPID PANEL
CHOL/HDL RATIO: 6.2 ratio — AB (ref <=5.0)
Cholesterol: 248 mg/dL — ABNORMAL HIGH (ref 125–200)
HDL: 40 mg/dL — ABNORMAL LOW (ref >=46)
LDL CALC: 154 mg/dL — AB (ref <130)
Triglycerides: 272 mg/dL — ABNORMAL HIGH (ref <150)
VLDL: 54 mg/dL — AB (ref <30)

## 2015-12-16 LAB — HEMOGLOBIN A1C
HEMOGLOBIN A1C: 6.3 % — AB (ref <5.7)
Mean Plasma Glucose: 134 mg/dL

## 2015-12-16 MED ORDER — PHENTERMINE HCL 37.5 MG PO TABS: 37.5000 mg | ORAL_TABLET | Freq: Every day | ORAL | Status: DC

## 2015-12-16 MED ORDER — BUPROPION HCL ER (XL) 150 MG PO TB24: 150.0000 mg | ORAL_TABLET | Freq: Every day | ORAL | Status: DC

## 2015-12-16 MED ORDER — CIPROFLOXACIN HCL 500 MG PO TABS: 500.0000 mg | ORAL_TABLET | Freq: Two times a day (BID) | ORAL | Status: DC

## 2015-12-16 NOTE — Progress Notes (Signed)
Subjective:    Patient ID: Kelly Reeves, female    DOB: 1965-12-25, 50 y.o.   MRN: [id number]  HPI  Patient is a 50 year old morbidly obese female who presents to the clinic with multiple concerns today as well as wanting lab work drawn. She does have a past medical history of hypothyroidism, hyperlipidemia, hypertension.  She would like her fasting cholesterol check since she has started livalo approximately 6 months ago.  Patient is on thyroid medication and she would like to make sure it is effective.  She does have an elevated fasting glucose and never had her A1c checked.  She has noticed an abnormal urine odor for the past few days like to make sure no infection is forming. She denies any fever, chills, nausea, flank pain or abdominal pain. There is no pain with urination.  She is very frustrated with her weight. She is trying to watch her diet and she is the heaviest she's ever been in her life. She admits to not exercising regularly. She would like to start medication today. She is also having some right ankle pain that has been present for over a year. She does have a history of bad right ankle sprains in her teen and early 20 years. She almost fell walking the other day. Her right ankle feels like is going to give way and very achy all the time. She is not currently doing anything to help with symptoms.   Patient doesn't have that she is depressed. She feels like her weight is out of control. She feels very emotional and like she doesn't want to go anywhere. She feels that her daughter is likely and there is to be with her.     Review of Systems  All other systems reviewed and are negative.      Objective:   Physical Exam  Constitutional: She is oriented to person, place, and time. She appears well-developed and well-nourished.  Morbid obesity  HENT:  Head: Normocephalic and atraumatic.  Right Ear: External ear normal.  Left Ear: External ear normal.  Nose: Nose  normal.  Mouth/Throat: Oropharynx is clear and moist. No oropharyngeal exudate.  Neck: Normal range of motion. Neck supple. No thyromegaly present.  Cardiovascular: Normal rate, regular rhythm and normal heart sounds.   Pulmonary/Chest: Effort normal and breath sounds normal. She has no wheezes.  No CVA tenderness  Abdominal: Soft. Bowel sounds are normal. She exhibits no distension and no mass. There is no tenderness. There is no rebound and no guarding.  Musculoskeletal:  Normal range of motion of right ankle. No tenderness to bony landmark palpation. Strength 5 out of 5 of right ankle. No swelling or bruising noted.  Lymphadenopathy:    She has no cervical adenopathy.  Neurological: She is alert and oriented to person, place, and time.  Psychiatric: Her behavior is normal.  Very tearful bearing encounter.          Assessment & Plan:  Morbid obesity/depression-is certainly evident patient is depressed. I do feel like we need to work on her weight loss. Started Wellbutrin 150 mg daily. Discussed side effects with patient. I also started phentermine. Discussed side effects of increased anxiety. Patient has tolerated this medicine in the past. Her vitals are controlled today. Will watch for any adverse reactions closely. Discussed what a healthy diet and regular exercise looks like. Follow-up in one month with me.  Hypothyroidism-we'll make sure in the appropriate normal range and adjust accordingly. Continue levothyroxine.  Elevated fasting  glucose-we'll check A1c.  Hyperlipidemia-we'll check lipid level and adjust medication accordingly.  Abnormal urine odor- dipstick urine positive for nitrates however negative for blood and leukocytes. We'll go ahead and treat with Cipro. Will culture urine.  Right ankle pain/weakness-we'll x-ray ankle today. Given exercises to help strengthen her ankle. It is likely if she's had multiple strains and the past that her tendons and ligaments are week  and need physical therapy. Start with home exercises and we can progress to physical therapy if needed.

## 2015-12-16 NOTE — Patient Instructions (Signed)
Acute Ankle Sprain With Phase II Rehab An acute ankle sprain is a partial or complete tear in one or more of the ligaments of the ankle due to traumatic injury. The severity of the injury depends on both the number of ligaments sprained and the grade of sprain. There are 3 grades of sprains.  A grade 1 sprain is a mild sprain. There is a slight pull without obvious tearing. There is no loss of strength, and the muscle and ligament are the correct length.  A grade 2 sprain is a moderate sprain. There is tearing of fibers within the substance of the ligament where it connects two bones or two cartilages. The length of the ligament is increased, and there is usually decreased strength.  A grade 3 sprain is a complete rupture of the ligament and is uncommon. In addition to the grade of sprain, there are 3 types of ankle sprains.  Lateral ankle sprains. This is a sprain of one or more of the 3 ligaments on the outer side (lateral) of the ankle. These are the most common sprains. Medial ankle sprains. There is one large triangular ligament on the inner side (medial) of the ankle that is susceptible to injury. Medial ankle sprains are less common. Syndesmosis, "high ankle," sprains. The syndesmosis is the ligament that connects the two bones of the lower leg. Syndesmosis sprains usually only occur with very severe ankle sprains. SYMPTOMS  Pain, tenderness, and swelling in the ankle, starting at the side of injury that may progress to the whole ankle and foot with time.  "Pop" or tearing sensation at the time of injury.  Bruising that may spread to the heel.  Impaired ability to walk soon after injury. CAUSES   Acute ankle sprains are caused by trauma placed on the ankle that temporarily forces or pries the anklebone (talus) out of its normal socket.  Stretching or tearing of the ligaments that normally hold the joint in place (usually due to a twisting injury). RISK INCREASES WITH:  Previous  ankle sprain.  Sports in which the foot may land awkwardly (basketball, volleyball, soccer) or walking or running on uneven or rough surfaces.  Shoes with inadequate support to prevent sideways motion when stress occurs.  Poor strength and flexibility.  Poor balance skills.  Contact sports. PREVENTION  Warm up and stretch properly before activity.  Maintain physical fitness:  Ankle and leg flexibility, muscle strength, and endurance.  Cardiovascular fitness.  Balance training activities.  Use proper technique and have a coach correct improper technique.  Taping, protective strapping, bracing, or high-top tennis shoes may help prevent injury. Initially, tape is best. However, it loses most of its support function within 10 to 15 minutes.  Wear proper fitted protective shoes. Combining high-top shoes with taping or bracing is more effective than using either alone.  Provide the ankle with support during sports and practice activities for 12 months following injury. PROGNOSIS   If treated properly, ankle sprains can be expected to recover completely. However, the length of recovery depends on the degree of injury.  A grade 1 sprain usually heals enough in 5 to 7 days to allow modified activity and requires an average of 6 weeks to heal completely.  A grade 2 sprain requires 6 to 10 weeks to heal completely.  A grade 3 sprain requires 12 to 16 weeks to heal.  A syndesmosis sprain often takes more than 3 months to heal. RELATED COMPLICATIONS   Frequent recurrence of symptoms may result   in a chronic problem. Appropriately addressing the problem the first time decreases the frequency of recurrence and optimizes healing time. Severity of initial sprain does not predict the likelihood of later instability.  Injury to other structures (bone, cartilage, or tendon).  Chronically unstable or arthritic ankle joint are possible with repeated sprains. TREATMENT Treatment initially  involves the use of ice, medicine, and compression bandages to help reduce pain and inflammation. Ankle sprains are usually immobilized in a walking cast or boot to allow for healing. Crutches may be recommended to reduce pressure on the injury. After immobilization, strengthening and stretching exercises may be necessary to regain strength and a full range of motion. Surgery is rarely needed to treat ankle sprains. MEDICATION   Nonsteroidal anti-inflammatory medicines, such as aspirin and ibuprofen (do not take for the first 3 days after injury or within 7 days before surgery), or other minor pain relievers, such as acetaminophen, are often recommended. Take these as directed by your caregiver. Contact your caregiver immediately if any bleeding, stomach upset, or signs of an allergic reaction occur from these medicines.  Ointments applied to the skin may be helpful.  Pain relievers may be prescribed as necessary by your caregiver. Do not take prescription pain medicine for longer than 4 to 7 days. Use only as directed and only as much as you need. HEAT AND COLD  Cold treatment (icing) is used to relieve pain and reduce inflammation for acute and chronic cases. Cold should be applied for 10 to 15 minutes every 2 to 3 hours for inflammation and pain and immediately after any activity that aggravates your symptoms. Use ice packs or an ice massage.  Heat treatment may be used before performing stretching and strengthening activities prescribed by your caregiver. Use a heat pack or a warm soak. SEEK IMMEDIATE MEDICAL CARE IF:   Pain, swelling, or bruising worsens despite treatment.  You experience pain, numbness, discoloration, or coldness in the foot or toes.  New, unexplained symptoms develop. (Drugs used in treatment may produce side effects.) EXERCISES  PHASE II EXERCISES RANGE OF MOTION (ROM) AND STRETCHING EXERCISES - Ankle Sprain, Acute-Phase II, Weeks 3 to 4 After your physician, physical  therapist, or athletic trainer feels your knee has made progress significant enough to begin more advanced exercises, he or she may recommend completing some of the following exercises. Although each person heals at different rates, most people will be ready for these exercises between 3 and 4 weeks after their injury. Do not begin these exercises until you have your caregiver's permission. He or she may also advise you to continue with the exercises which you completed in Phase I of your rehabilitation. While completing these exercises, remember:   Restoring tissue flexibility helps normal motion to return to the joints. This allows healthier, less painful movement and activity.  An effective stretch should be held for at least 30 seconds.  A stretch should never be painful. You should only feel a gentle lengthening or release in the stretched tissue. RANGE OF MOTION - Ankle Plantar Flexion   Sit with your right / left leg crossed over your opposite knee.  Use your opposite hand to pull the top of your foot and toes toward you.  You should feel a gentle stretch on the top of your foot/ankle. Hold this position for __________. Repeat __________ times. Complete __________ times per day.  RANGE OF MOTION - Ankle Eversion  Sit with your right / left ankle crossed over your opposite knee.    Grip your foot with your opposite hand, placing your thumb on the top of your foot and your fingers across the bottom of your foot.  Gently push your foot downward with a slight rotation so your littlest toes rise slightly  You should feel a gentle stretch on the inside of your ankle. Hold the stretch for __________ seconds. Repeat __________ times. Complete this exercise __________ times per day.  RANGE OF MOTION - Ankle Inversion  Sit with your right / left ankle crossed over your opposite knee.  Grip your foot with your opposite hand, placing your thumb on the bottom of your foot and your fingers across  the top of your foot.  Gently pull your foot so the smallest toe comes toward you and your thumb pushes the inside of the ball of your foot away from you.  You should feel a gentle stretch on the outside of your ankle. Hold the stretch for __________ seconds. Repeat __________ times. Complete this exercise __________ times per day.  STRETCH - Gastrocsoleus  Sit with your right / left leg extended. Holding onto both ends of a belt or towel, loop it around the ball of your foot.  Keeping your right / left ankle and foot relaxed and your knee straight, pull your foot and ankle toward you using the belt/towel.  You should feel a gentle stretch behind your calf or knee. Hold this position for __________ seconds. Repeat __________ times. Complete this stretch __________ times per day.  RANGE OF MOTION - Ankle Dorsiflexion, Active Assisted  Remove shoes and sit on a chair that is preferably not on a carpeted surface.  Place right / left foot under knee. Extend your opposite leg for support.  Keeping your heel down, slide your right / left foot back toward the chair until you feel a stretch at your ankle or calf. If you do not feel a stretch, slide your bottom forward to the edge of the chair while still keeping your heel down.  Hold this stretch for __________ seconds. Repeat __________ times. Complete this stretch __________ times per day.  STRETCH - Gastroc, Standing   Place hands on wall.  Extend right / left leg and place a folded washcloth under the arch of your foot for support. Keep the front knee somewhat bent.  Slightly point your toes inward on your back foot.  Keeping your right / left heel on the floor and your knee straight, shift your weight toward the wall, not allowing your back to arch.  You should feel a gentle stretch in the calf. Hold this position for __________ seconds. Repeat __________ times. Complete this stretch __________ times per day. STRETCH - Soleus,  Standing  Place hands on wall.  Extend right / left leg and place a folded washcloth under the arch of your foot for support. Keep the front knee somewhat bent.  Slightly point your toes inward on your back foot.  Keep your right / left heel on the floor, bend your back knee, and slightly shift your weight over the back leg so that you feel a gentle stretch deep in your back calf.  Hold this position for __________ seconds. Repeat __________ times. Complete this stretch __________ times per day. STRETCH - Gastrocsoleus, Standing Note: This exercise can place a lot of stress on your foot and ankle. Please complete this exercise only if specifically instructed by your caregiver.   Place the ball of your right / left foot on a step, keeping your other   foot firmly on the same step.  Hold on to the wall or a rail for balance.  Slowly lift your other foot, allowing your body weight to press your heel down over the edge of the step.  You should feel a stretch in your right / left calf.  Hold this position for __________ seconds.  Repeat this exercise with a slight bend in your knee. Repeat __________ times. Complete this stretch __________ times per day.  STRENGTHENING EXERCISES - Ankle Sprain, Acute-Phase II Around 3 to 4 weeks after your injury, you may progress to some of these exercises in your rehabilitation program. Do not begin these until you have your caregiver's permission. Although your condition has improved, the Phase I exercises will continue to be helpful and you may continue to complete them. As you complete strengthening exercises, remember:   Strong muscles with good endurance tolerate stress better.  Do the exercises as initially prescribed by your caregiver. Progress slowly with each exercise, gradually increasing the number of repetitions and weight used under his or her guidance.  You may experience muscle soreness or fatigue, but the pain or discomfort you are trying  to eliminate should never worsen during these exercises. If this pain does worsen, stop and make certain you are following the directions exactly. If the pain is still present after adjustments, discontinue the exercise until you can discuss the trouble with your caregiver. STRENGTH - Plantar-flexors, Standing  Stand with your feet shoulder width apart. Steady yourself with a wall or table using as little support as needed.  Keeping your weight evenly spread over the width of your feet, rise up on your toes.*  Hold this position for __________ seconds. Repeat __________ times. Complete this exercise __________ times per day.  *If this is too easy, shift your weight toward your right / left leg until you feel challenged. Ultimately, you may be asked to do this exercise with your right / left foot only. STRENGTH - Dorsiflexors and Plantar-flexors, Heel/toe Walking  Dorsiflexion: Walk on your heels only. Keep your toes as high as possible.  Walk for ____________________ seconds/feet.  Repeat __________ times. Complete __________ times per day.  Plantar flexion: Walk on your toes only. Keep your heels as high as possible.  Walk for ____________________ seconds/feet. Repeat __________ times. Complete __________ times per day.  BALANCE - Tandem Walking  Place your uninjured foot on a line 2 to 4 inches wide and at least 10 feet long.  Keeping your balance without using anything for extra support, place your right / left heel directly in front of your other foot.  Slowly raise your back foot up, lifting from the heel to the toes, and place it directly in front of the right / left foot.  Continue to walk along the line slowly. Walk for ____________________ feet. Repeat ____________________ times. Complete ____________________ times per day. BALANCE - Inversion/Eversion Use caution, these are advanced level exercises. Do not begin them until you are advised to do so.   Create a balance  board using a sturdy board about 1  feet long and at 1 to 1  feet wide and a 1  inch diameter rod or pipe that is as long as the board's width. A copper pipe or a solid broomstick work well.  Stand on a non-carpeted surface near a countertop or wall. Step onto the board so that your feet are hip-width apart and equally straddle the rod/pipe.  Keeping your feet in place, complete these two exercises   without shifting your upper body or hips:  Tip the board from side-to-side. Control the movement so the board does not forcefully strike the ground. The board should silently tap the ground.  Tip the board side-to-side without striking the ground. Occasionally pause and maintain a steady position at various points.  Repeat the first two exercises, but use only your right / left foot. Place your right / left foot directly over the rod/pipe. Repeat __________ times. Complete this exercise __________ times a day. BALANCE - Plantar/Dorsi Flexion Use caution, these are advanced level exercises. Do not begin them until you are advised to do so.   Create a balance board using a sturdy board about 1  feet long and at 1 to 1  feet wide and a 1  inch diameter rod or pipe that is as long as the board's width. A copper pipe or a solid broomstick work well.  Stand on a non-carpeted surface near a countertop or wall. Stand on the board so that the rod/pipe runs under the arches in your feet.  Keeping your feet in place, complete these two exercises without shifting your upper body or hips:  Tip the board from side-to-side. Control the movement so the board does not forcefully strike the ground. The board should silently tap the ground.  Tip the board side-to-side without striking the ground. Occasionally pause and maintain a steady position at various points.  Repeat the first two exercises, but use only your right / left foot. Stand in the center of the board. Repeat __________ times. Complete this  exercise __________ times a day. STRENGTH - Plantar-flexors, Eccentric Note: This exercise can place a lot of stress on your foot and ankle. Please complete this exercise only if specifically instructed by your caregiver.   Place the balls of your feet on a step. With your hands, use only enough support from a wall or rail to keep your balance.  Keep your knees straight and rise up on your toes.  Slowly shift your weight entirely to your toes and pick up your opposite foot. Gently and with controlled movement, lower your weight through your right / left foot so that your heel drops below the level of the step. You will feel a slight stretch in the back of your calf at the ending position.  Use the healthy leg to help rise up onto the balls of both feet, then lower weight only on the right / left leg again. Build up to 15 repetitions. Then progress to 3 consecutive sets of 15 repetitions.*  After completing the above exercise, complete the same exercise with a slight knee bend (about 30 degrees). Again, build up to 15 repetitions. Then progress to 3 consecutive sets of 15 repetitions.* Perform this exercise __________ times per day.  *When you easily complete 3 sets of 15, your physician, physical therapist, or athletic trainer may advise you to add resistance by wearing a backpack filled with additional weight.   This information is not intended to replace advice given to you by your health care provider. Make sure you discuss any questions you have with your health care provider.   Document Released: 10/10/2005 Document Revised: 07/11/2014 Document Reviewed: 10/02/2008 [employer] Interactive Patient Education 2016 [employer]  

## 2015-12-17 LAB — TSH: TSH: 4.3 m[IU]/L

## 2015-12-18 ENCOUNTER — Other Ambulatory Visit: Payer: Self-pay | Admitting: *Deleted

## 2015-12-18 ENCOUNTER — Other Ambulatory Visit: Payer: Self-pay | Admitting: Physician Assistant

## 2015-12-18 ENCOUNTER — Encounter: Payer: Self-pay | Admitting: Physician Assistant

## 2015-12-18 DIAGNOSIS — R748 Abnormal levels of other serum enzymes: Secondary | ICD-10-CM | POA: Insufficient documentation

## 2015-12-18 DIAGNOSIS — R7303 Prediabetes: Secondary | ICD-10-CM | POA: Insufficient documentation

## 2015-12-18 MED ORDER — PITAVASTATIN CALCIUM 4 MG PO TABS: 4.0000 mg | ORAL_TABLET | Freq: Every day | ORAL | Status: DC

## 2015-12-18 MED ORDER — LEVOTHYROXINE SODIUM 112 MCG PO TABS: ORAL_TABLET | ORAL | Status: DC

## 2015-12-19 LAB — HEPATITIS PANEL, ACUTE
HCV Ab: NEGATIVE
HEP A IGM: NONREACTIVE
HEP B C IGM: NONREACTIVE
Hepatitis B Surface Ag: NEGATIVE

## 2015-12-19 LAB — URINE CULTURE: Colony Count: >=100000

## 2015-12-21 ENCOUNTER — Other Ambulatory Visit: Payer: Self-pay | Admitting: Physician Assistant

## 2015-12-21 NOTE — Addendum Note (Signed)
Addended by: Jomarie LongsBREEBACK, Denya Buckingham L on: 12/21/2015 07:59 AM   Modules accepted: Orders

## 2015-12-28 ENCOUNTER — Ambulatory Visit (INDEPENDENT_AMBULATORY_CARE_PROVIDER_SITE_OTHER): Payer: BLUE CROSS/BLUE SHIELD

## 2015-12-28 DIAGNOSIS — R29898 Other symptoms and signs involving the musculoskeletal system: Secondary | ICD-10-CM

## 2015-12-28 DIAGNOSIS — M65871 Other synovitis and tenosynovitis, right ankle and foot: Secondary | ICD-10-CM | POA: Diagnosis not present

## 2015-12-28 DIAGNOSIS — M93871 Other specified osteochondropathies, right ankle and foot: Secondary | ICD-10-CM | POA: Diagnosis not present

## 2015-12-28 DIAGNOSIS — M25571 Pain in right ankle and joints of right foot: Secondary | ICD-10-CM

## 2015-12-29 ENCOUNTER — Telehealth: Payer: Self-pay | Admitting: *Deleted

## 2015-12-29 ENCOUNTER — Encounter: Payer: Self-pay | Admitting: Physician Assistant

## 2015-12-29 DIAGNOSIS — R748 Abnormal levels of other serum enzymes: Secondary | ICD-10-CM

## 2015-12-29 DIAGNOSIS — M25571 Pain in right ankle and joints of right foot: Secondary | ICD-10-CM | POA: Insufficient documentation

## 2015-12-29 NOTE — Telephone Encounter (Signed)
New ultrasound order placed

## 2015-12-30 ENCOUNTER — Ambulatory Visit (INDEPENDENT_AMBULATORY_CARE_PROVIDER_SITE_OTHER): Payer: BLUE CROSS/BLUE SHIELD

## 2015-12-30 ENCOUNTER — Other Ambulatory Visit: Payer: BLUE CROSS/BLUE SHIELD

## 2015-12-30 ENCOUNTER — Encounter: Payer: Self-pay | Admitting: Physician Assistant

## 2015-12-30 DIAGNOSIS — K8080 Other cholelithiasis without obstruction: Secondary | ICD-10-CM

## 2015-12-30 DIAGNOSIS — K8020 Calculus of gallbladder without cholecystitis without obstruction: Secondary | ICD-10-CM | POA: Insufficient documentation

## 2016-01-06 ENCOUNTER — Ambulatory Visit (INDEPENDENT_AMBULATORY_CARE_PROVIDER_SITE_OTHER): Payer: BLUE CROSS/BLUE SHIELD | Admitting: Sports Medicine

## 2016-01-06 ENCOUNTER — Encounter: Payer: Self-pay | Admitting: Sports Medicine

## 2016-01-06 DIAGNOSIS — M25571 Pain in right ankle and joints of right foot: Secondary | ICD-10-CM

## 2016-01-06 MED ORDER — MELOXICAM 15 MG PO TABS: ORAL_TABLET | ORAL | Status: DC

## 2016-01-06 NOTE — Assessment & Plan Note (Signed)
Personal symptom is not pain but more ankle instability, static stabilizers appear intact on exam and on MRI meaning we need to focus more on the dynamic stabilizers. We will proceed with aggressive formal physical therapy, and boot immobilization initially for 2 weeks whenever she's not in therapy. Adding meloxicam for breakthrough pain. Return to see me in 6 weeks, we will consider injection and custom orthotics if insufficient improvement.

## 2016-01-06 NOTE — Progress Notes (Signed)
   Subjective:    I'm seeing this patient as a consultation for:  Kelly GawJade Breeback, PA-C  CC: right ankle pain  HPI: This is a pleasant 50 year old female, she has no history of acute injury, more recently she's noted increasing instability without much pain in her right ankle, predominantly localized around the medial talar dome with recurrent ankle sprains. 2 treated conservatively appropriately and an MRI was ordered the results of which will be dictated below. Symptoms are moderate, persistent without radiation.  Past medical history, Surgical history, Family history not pertinant except as noted below, Social history, Allergies, and medications have been entered into the medical record, reviewed, and no changes needed.   Review of Systems: No headache, visual changes, nausea, vomiting, diarrhea, constipation, dizziness, abdominal pain, skin rash, fevers, chills, night sweats, weight loss, swollen lymph nodes, body aches, joint swelling, muscle aches, chest pain, shortness of breath, mood changes, visual or auditory hallucinations.   Objective:   General: Well Developed, well nourished, and in no acute distress.  Neuro/Psych: Alert and oriented x3, extra-ocular muscles intact, able to move all 4 extremities, sensation grossly intact. Skin: Warm and dry, no rashes noted.  Respiratory: Not using accessory muscles, speaking in full sentences, trachea midline.  Cardiovascular: Pulses palpable, no extremity edema. Abdomen: Does not appear distended. Right Ankle: No visible erythema or swelling. Range of motion is full in all directions. Strength is 5/5 in all directions. Stable lateral and medial ligaments; squeeze test and kleiger test unremarkable; Only minimal tenderness at the medial talar dome No pain at base of 5th MT; No tenderness over cuboid; No tenderness over N spot or navicular prominence No tenderness on posterior aspects of lateral and medial malleolus No sign of peroneal  tendon subluxations; Negative tarsal tunnel tinel's Able to walk 4 steps.  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  Impression and Recommendations:   This case required medical decision making of moderate complexity.

## 2016-01-13 ENCOUNTER — Encounter: Payer: Self-pay | Admitting: Physician Assistant

## 2016-01-13 ENCOUNTER — Ambulatory Visit (INDEPENDENT_AMBULATORY_CARE_PROVIDER_SITE_OTHER): Payer: BLUE CROSS/BLUE SHIELD | Admitting: Physical Therapy

## 2016-01-13 ENCOUNTER — Ambulatory Visit (INDEPENDENT_AMBULATORY_CARE_PROVIDER_SITE_OTHER): Payer: BLUE CROSS/BLUE SHIELD | Admitting: Physician Assistant

## 2016-01-13 ENCOUNTER — Encounter: Payer: Self-pay | Admitting: Physical Therapy

## 2016-01-13 DIAGNOSIS — M25671 Stiffness of right ankle, not elsewhere classified: Secondary | ICD-10-CM | POA: Diagnosis not present

## 2016-01-13 DIAGNOSIS — E039 Hypothyroidism, unspecified: Secondary | ICD-10-CM

## 2016-01-13 DIAGNOSIS — F32A Depression, unspecified: Secondary | ICD-10-CM | POA: Insufficient documentation

## 2016-01-13 DIAGNOSIS — M25571 Pain in right ankle and joints of right foot: Secondary | ICD-10-CM

## 2016-01-13 DIAGNOSIS — M6281 Muscle weakness (generalized): Secondary | ICD-10-CM

## 2016-01-13 DIAGNOSIS — F329 Major depressive disorder, single episode, unspecified: Secondary | ICD-10-CM | POA: Diagnosis not present

## 2016-01-13 NOTE — Patient Instructions (Signed)
Ankle Alphabet   [place] [phone]    Using left ankle and foot only, trace the letters of the alphabet. Perform A to .  Repeat __1__ times per set. Do __1__ sets per session. Do _2___ sessions per day  Dorsiflexion: Self-Mobilization (Standing)    With right foot on step, lean forward until gentle stretch is felt. Hold _30___ seconds. Relax. Repeat __1__ times per set. Do __1__ sets per session. Do __2__ sessions per day.  Heel Raise: Bilateral (Standing)    Rise on balls of feet. Repeat __10__ times per set, toes straight, toes in, toes out. Do __1__ sets per session. Do __2__ sessions per day.  Balance: Unilateral - Forward Lean - use wall or counter top for security, don't lock right knee.     Stand on right foot, hands on hips. Keeping hips level, bend forward as if to touch forehead to wall. Hold _1___ seconds. Relax. Repeat __8-10__ times per set. Do ___2-3_ sets per session. Do _2___ sessions per day.   Gastroc Stretch    Stand with right foot back, leg straight, forward leg bent. Keeping heel on floor, turned slightly out, lean into wall until stretch is felt in calf. Hold _30-45___ seconds. Repeat _1_ times per set. Do ___1_ sets per session. Do __2__ sessions per day.  Copyright  VHI. All rights reserved.

## 2016-01-13 NOTE — Progress Notes (Signed)
   Subjective:    Patient ID: Kelly Reeves, female    DOB: 1966/04/04, 50 y.o.   MRN: [id number]  HPI Ms. Kelly Reeves is a pleasant 50 year old female that returns to clinic for a weight loss management. She reports a 12 lbs weight loss in the last month. She reports starting Wellburtin 1 month ago and making significant diet modifications. She is currently being treated for a right ankle injury but plans to start increase exercise as tolerated. She has rx of phentermine but has not started.    Review of Systems  All other systems reviewed and are negative.      Objective:   Physical Exam  Constitutional: She is oriented to person, place, and time. She appears well-developed and well-nourished.  Morbidly obese  Neck: Normal range of motion. Neck supple. No tracheal deviation present. No thyromegaly present.  Cardiovascular: Normal rate, regular rhythm and normal heart sounds.   Pulmonary/Chest: Effort normal and breath sounds normal.  Neurological: She is alert and oriented to person, place, and time.  Psychiatric: She has a normal mood and affect.          Assessment & Plan:  Morbid obesity/abnormal weight gain- doing great. Encouraged to start phentermine for maximum results. Follow up in 4 weeks. Certainly Thyroid levels and wellbutrin could be helping patient as well.   Hypothyroidism- medication increased at last visit. Will recheck today and adjust accordingly.   Depression- since losing weigh and being on wellbutrin mood is much better. She is happy and excited about these changes.

## 2016-01-13 NOTE — Therapy (Signed)
[facility] [place] [address] [place], [place], [address] Phone: [phone]   Fax:  [phone]  Physical Therapy Evaluation  Patient Details  Name: Kelly Reeves MRN: [id number] Date of Birth: Feb 11, 1966 Referring Provider: Dr Benjamin Stain  Encounter Date: 01/13/2016      PT End of Session - 01/13/16 1146    Visit Number 1   Number of Visits 8   Date for PT Re-Evaluation 02/10/16   PT Start Time 1106   PT Stop Time 1146   PT Time Calculation (min) 40 min   Activity Tolerance Patient tolerated treatment well      Past Medical History  Diagnosis Date  . Hyperlipidemia   . Thyroid disease   . PCOS (polycystic ovarian syndrome)   . Hypertension     Past Surgical History  Procedure Laterality Date  . Cesarean section  01/31/00    There were no vitals filed for this visit.       Subjective Assessment - 01/13/16 1107    Subjective Pt reports she developed Rt ankle problems about 9-12 months ago.  She felt unstable and weak in her ankle.  She also had edema, this seems to have settled down.    Pertinent History 50 yo sprained Rt ankle.    How long can you sit comfortably? no limitations   How long can you walk comfortably? she walks with boot at work right now. without the limited to shorter distances.    Diagnostic tests x-ray/MRI had small piece of bone missing.    Patient Stated Goals walking for exercise,    Currently in Pain? No/denies            [facility] PT Assessment - 01/13/16 0001    Assessment   Medical Diagnosis Rt ankle pain   Referring Provider Dr Benjamin Stain   Onset Date/Surgical Date 03/16/15   Hand Dominance Right   Next MD Visit 02/17/16   Prior Therapy not for this   Precautions   Precautions None   Required Braces or Orthoses --  CAM boot while at work when she has to walk a lot   Balance Screen   Has the patient fallen in the past 6 months No   Has the patient had a decrease in  activity level because of a fear of falling?  No   Is the patient reluctant to leave their home because of a fear of falling?  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   Leisure shop  - has limited   Observation/Other Assessments   Focus on Therapeutic Outcomes (FOTO)  42% limited   Functional Tests   Functional tests Squat;Single leg stance   Squat   Comments WNL - pain in knees   Single Leg Stance   Comments time WNL, increased accessory motion on Rt LE    Posture/Postural Control   Posture Comments pes planus  some edema in top of Rt foot   ROM / Strength   AROM / PROM / Strength AROM;PROM;Strength   AROM   AROM Assessment Site Ankle   Right/Left Ankle Right;Left   Right Ankle Dorsiflexion -2   Right Ankle Plantar Flexion  45   Right Ankle Inversion 22   Right Ankle Eversion 30   Left Ankle Dorsiflexion 2  Passive 8   Left Ankle Plantar Flexion 52   Left Ankle Inversion 38   Left Ankle Eversion 35   PROM   PROM Assessment Site Ankle   Right/Left Ankle Right   Right Ankle Dorsiflexion 7   Right Ankle Inversion 33   Strength   Strength Assessment Site Hip;Knee;Ankle   Right/Left Hip --  bilat WNL    Right/Left Knee Right  Lt WNL   Right Knee Flexion 4+/5   Right Knee Extension 4+/5   Right/Left Ankle Right  Lt WNL   Right Ankle Dorsiflexion 4+/5   Right Ankle Plantar Flexion 4-/5   Right Ankle Inversion 5/5   Right Ankle Eversion 4+/5   Palpation   Palpation comment hypomobile Rt talus posteriorly, trigger point in Rt anterior tib and inverters with pain.                    [facility] Adult PT Treatment/Exercise - 01/13/16 0001    Exercises   Exercises  Ankle   Ankle Exercises: Stretches   Soleus Stretch 30 seconds   Gastroc Stretch 30 seconds   Ankle Exercises: Standing   SLS Rt with FWD leans   Heel Raises 10 reps  each, toes in/out/straight   Ankle Exercises: Seated   ABC's 1 rep                PT Education - 01/13/16 1141    Education provided Yes   Education Details HEP    Person(s) Educated Patient   Methods Demonstration;Handout   Comprehension Returned demonstration;Verbalized understanding             PT Long Term Goals - 01/13/16 1051    PT LONG TERM GOAL #1   Title I with HEP ( 02/10/16)    Time 4   Period Weeks   Status New   PT LONG TERM GOAL #2   Title reduce FOTO =/< 32% limited ( 02/10/16)    Time 4   Period Weeks   Status New   PT LONG TERM GOAL #3   Title report =/> 75% improvement in feelings of ankle stability ( 02/10/16)    Time 4   Period Weeks   Status New   PT LONG TERM GOAL #4   Title increase strength Rt knee/ankle =/> 5-/5 to be able to alternate on steps without difficulty ( 02/10/16)    Time 4   Period Weeks   Status New   PT LONG TERM GOAL #5   Title improve Rt ankle dorsiflexion =/> 15 degrees ( 02/10/16)    Time 4   Period Weeks   Status New               Plan - 01/13/16 1656    Clinical Impression Statement 50 yo female with h/o feelings of Rt ankle instability.  She presents with decreased ankle ROM, Rt LE strength and proprioception.  She is fearful of turning her ankle and falling.  The ankle has limited her abilty to exercise and lose weight and at times makes it difficult to walk at work.    Rehab Potential Excellent   PT Frequency 2x / week   PT Duration 4 weeks   PT Treatment/Interventions Ultrasound;Neuromuscular re-education;Patient/family education;Gait training;Cryotherapy;Stair training;Dry needling;Electrical Stimulation;Iontophoresis 4mg /ml Dexamethasone;Moist Heat;Therapeutic exercise;Manual techniques;Taping   PT Next Visit Plan ankle mobs, STW to ant  tib  and medial muscles, progress proprioception    Consulted and Agree with Plan of Care Patient      Patient will benefit from skilled therapeutic intervention in order to improve the following deficits and impairments:  Postural dysfunction, Increased edema, Decreased strength, Hypomobility, Decreased range of motion, Difficulty walking  Visit Diagnosis: Pain in right ankle and joints of right foot - Plan: PT plan of care cert/re-cert  Stiffness of right ankle, not elsewhere classified - Plan: PT plan of care cert/re-cert  Muscle weakness (generalized) - Plan: PT plan of care cert/re-cert     Problem List Patient Active Problem List   Diagnosis Date Noted  . Depression 01/13/2016  . Gallstones 12/30/2015  . Right ankle pain 12/29/2015  . Prediabetes 12/18/2015  . Elevated liver enzymes 12/18/2015  . Rash and nonspecific skin eruption 07/15/2015  . Obesity 10/16/2014  . Thyroid activity decreased 10/16/2014  . Abnormal weight gain 10/16/2014  . Obesity, Class III, BMI 40-49.9 (morbid obesity) (HCC) 08/21/2013  . Onychomycosis 08/21/2013  . Severe obesity (BMI >= 40) (HCC) 10/24/2012  . Hyperlipidemia 10/22/2011  . HTN (hypertension) 10/22/2011  . PCOS (polycystic ovarian syndrome) 10/22/2011  . Hypothyroidism 11/10/2010    Roderic ScarceSusan Shaver PT  01/13/2016, 5:07 PM  [facility] [address] [place], [place], [address] Phone: [phone]   Fax:  [phone]  Name: Girtha HakeLisa F Bastone MRN: [id number] Date of Birth: Sep 20, 1965

## 2016-01-20 ENCOUNTER — Encounter: Payer: Self-pay | Admitting: Physical Therapy

## 2016-01-20 ENCOUNTER — Ambulatory Visit (INDEPENDENT_AMBULATORY_CARE_PROVIDER_SITE_OTHER): Payer: BLUE CROSS/BLUE SHIELD | Admitting: Physical Therapy

## 2016-01-20 DIAGNOSIS — M25571 Pain in right ankle and joints of right foot: Secondary | ICD-10-CM

## 2016-01-20 DIAGNOSIS — M25671 Stiffness of right ankle, not elsewhere classified: Secondary | ICD-10-CM

## 2016-01-20 DIAGNOSIS — M6281 Muscle weakness (generalized): Secondary | ICD-10-CM

## 2016-01-20 NOTE — Therapy (Signed)
[place], [place], [address] Phone: [phone]   Fax:  [phone]  Physical Therapy Treatment  Patient Details  Name: Kelly Reeves MRN: [id number] Date of Birth: 01-Jul-1966 Referring Provider: Dr Darene Lamer  Encounter Date: 01/20/2016      PT End of Session - 01/20/16 0938    Visit Number 2   Number of Visits 8   Date for PT Re-Evaluation 02/10/16   PT Start Time 0938   PT Stop Time 1020   PT Time Calculation (min) 42 min   Activity Tolerance Patient tolerated treatment well      Past Medical History  Diagnosis Date  . Hyperlipidemia   . Thyroid disease   . PCOS (polycystic ovarian syndrome)   . Hypertension     Past Surgical History  Procedure Laterality Date  . Cesarean section  01/31/00    There were no vitals filed for this visit.      Subjective Assessment - 01/20/16 0946    Subjective Pt didn't have her handout for exercise, so she performed the ones she could remember.  She thinks she over stretched the first day because she had a bad charlie horse in her calf that night.    Currently in Pain? No/denies            [facility] PT Assessment - 01/20/16 0001    Assessment   Medical Diagnosis Rt ankle pain   Referring Provider Dr T   Onset Date/Surgical Date 03/16/15   Hand Dominance Right   Next MD Visit 02/17/16   AROM   Right Ankle Dorsiflexion 5                     [facility] Adult PT Treatment/Exercise - 01/20/16 0001    Exercises   Exercises Ankle   Ankle Exercises: Stretches   Gastroc Stretch 30 seconds  prostretch bilat   Other Stretch into Rt DF with foot on 13" step   Ankle Exercises: Aerobic   Stationary Bike Nustep L5x5'   Ankle Exercises: Standing   BAPS Standing;Level 2;15 reps  12/6, 3/9, CW/CCW Rt LE   SLS 5x30sec on black thera pad Rt    Ankle Exercises: Seated   Other Seated Ankle Exercises 3x10 leg extensions, 2 plates   Ankle Exercises:  Supine   T-Band 3x10 DF with green band   Other Supine Ankle Exercises 3x10 bridges                     PT Long Term Goals - 01/20/16 0949    PT LONG TERM GOAL #1   Title I with HEP ( 02/10/16)    Time 4   Period Weeks   Status On-going   PT LONG TERM GOAL #2   Title reduce FOTO =/< 32% limited ( 02/10/16)    Time 4   Period Weeks   Status On-going   PT LONG TERM GOAL #3   Title report =/> 75% improvement in feelings of ankle stability ( 02/10/16)    Time 4   Period Weeks   Status On-going   PT LONG TERM GOAL #4   Title increase strength Rt knee/ankle =/> 5-/5 to be able to alternate on steps without difficulty ( 02/10/16)    Time 4   Period Weeks   Status On-going   PT LONG TERM GOAL #5   Title improve Rt ankle dorsiflexion =/> 15 degrees ( 02/10/16)  Time 4   Period Weeks   Status On-going               Plan - 01/20/16 1000    Clinical Impression Statement Thi is [place]'s second visit, no goals met. She fatigues quickly in her ankle with proprioception exercises. Showing good improvement in Rt ankle dorsiflexion   Rehab Potential Excellent   PT Frequency 2x / week   PT Duration 4 weeks   PT Treatment/Interventions Ultrasound;Neuromuscular re-education;Patient/family education;Gait training;Cryotherapy;Stair training;Dry needling;Electrical Stimulation;Iontophoresis 20m/ml Dexamethasone;Moist Heat;Therapeutic exercise;Manual techniques;Taping   PT Next Visit Plan add theraband ankle strengthening   Consulted and Agree with Plan of Care Patient      Patient will benefit from skilled therapeutic intervention in order to improve the following deficits and impairments:  Postural dysfunction, Increased edema, Decreased strength, Hypomobility, Decreased range of motion, Difficulty walking  Visit Diagnosis: Pain in right ankle and joints of right foot  Stiffness of right ankle, not elsewhere classified  Muscle weakness (generalized)     Problem  List Patient Active Problem List   Diagnosis Date Noted  . Depression 01/13/2016  . Gallstones 12/30/2015  . Right ankle pain 12/29/2015  . Prediabetes 12/18/2015  . Elevated liver enzymes 12/18/2015  . Rash and nonspecific skin eruption 07/15/2015  . Obesity 10/16/2014  . Thyroid activity decreased 10/16/2014  . Abnormal weight gain 10/16/2014  . Obesity, Class III, BMI 40-49.9 (morbid obesity) ([place] 08/21/2013  . Onychomycosis 08/21/2013  . Severe obesity (BMI >= 40) ([place] 10/24/2012  . Hyperlipidemia 10/22/2011  . HTN (hypertension) 10/22/2011  . PCOS (polycystic ovarian syndrome) 10/22/2011  . Hypothyroidism 11/10/2010    SJeral PinchPT  01/20/2016, 10:19 AM  [facility] [place] [address]Phone: [phone]  Fax:  [phone] Name: Kelly EBRAHIMIMRN: [id number] of Birth: [birth year]

## 2016-01-22 ENCOUNTER — Encounter: Payer: BLUE CROSS/BLUE SHIELD | Admitting: Rehabilitative and Restorative Service Providers"

## 2016-01-27 ENCOUNTER — Encounter: Payer: Self-pay | Admitting: Physical Therapy

## 2016-01-27 ENCOUNTER — Ambulatory Visit (INDEPENDENT_AMBULATORY_CARE_PROVIDER_SITE_OTHER): Payer: BLUE CROSS/BLUE SHIELD | Admitting: Physical Therapy

## 2016-01-27 DIAGNOSIS — M6281 Muscle weakness (generalized): Secondary | ICD-10-CM

## 2016-01-27 DIAGNOSIS — M25671 Stiffness of right ankle, not elsewhere classified: Secondary | ICD-10-CM

## 2016-01-27 DIAGNOSIS — M25571 Pain in right ankle and joints of right foot: Secondary | ICD-10-CM

## 2016-01-27 NOTE — Patient Instructions (Signed)
Inversion: Resisted perform in a seated position, Left leg crossed behind right  [place] [phone]   Cross legs with right leg in front, foot in tubing loop. Hold tubing around left foot to resist and turn foot in. Repeat __10__ times per set. Do __3__ sets per session. Do __1__ sessions per day.   Eversion: Resisted   With right foot in tubing loop, hold tubing around other foot to resist and turn foot out. Repeat __10__ times per set. Do ___3_ sets per session. Do _1___ sessions per day.  Plantar Flexion: Resisted   Anchor behind, tubing around right foot, press down. Repeat _10___ times per set. Do _3___ sets per session. Do __1__ sessions per day.  Dorsiflexion: Resisted   Facing anchor, tubing around right foot, pull toward face.  Repeat _10__ times per set. Do __3__ sets per session. Do __1__ sessions per day.  Copyright  VHI. All rights reserved.

## 2016-01-27 NOTE — Therapy (Signed)
[facility] [place] [address] [place], [place], [address] Phone: [phone]   Fax:  [phone]  Physical Therapy Treatment  Patient Details  Name: Kelly Reeves MRN: [id number] Date of Birth: February 18, 1966 Referring Provider: Dr Karie Schwalbe  Encounter Date: 01/27/2016      PT End of Session - 01/27/16 0951    Visit Number 3   Number of Visits 8   Date for PT Re-Evaluation 02/10/16   PT Start Time 0950   PT Stop Time 1020   PT Time Calculation (min) 30 min   Activity Tolerance Patient tolerated treatment well      Past Medical History:  Diagnosis Date  . Hyperlipidemia   . Hypertension   . PCOS (polycystic ovarian syndrome)   . Thyroid disease     Past Surgical History:  Procedure Laterality Date  . CESAREAN SECTION  01/31/00    There were no vitals filed for this visit.      Subjective Assessment - 01/27/16 0951    Subjective Pt reports she feels like she can walk normal, not having any pain, was sore in her quads after last visit   Patient Stated Goals walking for exercise,    Currently in Pain? No/denies            [facility] PT Assessment - 01/27/16 0001      Assessment   Medical Diagnosis Rt ankle pain   Referring Provider Dr T   Onset Date/Surgical Date 03/16/15     AROM   Right/Left Ankle Right   Right Ankle Dorsiflexion 12                     [facility] Adult PT Treatment/Exercise - 01/27/16 0001      Self-Care   Self-Care Other Self-Care Comments   Other Self-Care Comments  discussed restarting her walking program, start out with 1/4 mile then slowly build from there.      Ankle Exercises: Aerobic   Stationary Bike L2x5'     Ankle Exercises: Standing   SLS at rebounder, red ball tosses, on firm surface, then on blue therapad   Other Standing Ankle Exercises cross over step ups on 10" step 2x10  each side   Other Standing Ankle Exercises side stepping on half bolster x 10 reps each side.                  PT Education - 01/27/16 1009    Education provided Yes   Education Details ankle t-band   Person(s) Educated Patient   Methods Explanation;Handout;Demonstration   Comprehension Returned demonstration             PT Long Term Goals - 01/27/16 0954      PT LONG TERM GOAL #1   Title I with HEP ( 02/10/16)    Time 4   Period Weeks   Status On-going     PT LONG TERM GOAL #2   Title reduce FOTO =/< 32% limited ( 02/10/16)    Time 4   Period Weeks   Status On-going     PT LONG TERM GOAL #3   Title report =/> 75% improvement in feelings of ankle stability ( 02/10/16)    Time 4   Period Weeks   Status Achieved  80% improved with daily activity, hasn't put herself on challenging terrain yet     PT LONG TERM GOAL #4   Title increase strength Rt knee/ankle =/> 5-/5 to be able  to alternate on steps without difficulty ( 02/10/16)    Time 4   Period Weeks   Status On-going     PT LONG TERM GOAL #5   Title improve Rt ankle dorsiflexion =/> 15 degrees ( 02/10/16)    Time 4   Period Weeks   Status On-going               Plan - 01/27/16 1013    Clinical Impression Statement Avory is making great progress to her goals. She should meet them in the next one to two weeks. She is going to push herself and begin returning to her walking program over the next wk.    Rehab Potential Excellent   PT Frequency 2x / week   PT Duration 4 weeks   PT Treatment/Interventions Ultrasound;Neuromuscular re-education;Patient/family education;Gait training;Cryotherapy;Stair training;Dry needling;Electrical Stimulation;Iontophoresis 4mg /ml Dexamethasone;Moist Heat;Therapeutic exercise;Manual techniques;Taping   PT Next Visit Plan see how new HEP is, progress to walking on uneven surfaces.  See if she practiced walking in her yard   Consulted and Agree with Plan of Care Patient      Patient will benefit from skilled therapeutic intervention in order to improve the following  deficits and impairments:  Postural dysfunction, Increased edema, Decreased strength, Hypomobility, Decreased range of motion, Difficulty walking  Visit Diagnosis: Pain in right ankle and joints of right foot  Stiffness of right ankle, not elsewhere classified  Muscle weakness (generalized)     Problem List Patient Active Problem List   Diagnosis Date Noted  . Depression 01/13/2016  . Gallstones 12/30/2015  . Right ankle pain 12/29/2015  . Prediabetes 12/18/2015  . Elevated liver enzymes 12/18/2015  . Rash and nonspecific skin eruption 07/15/2015  . Obesity 10/16/2014  . Thyroid activity decreased 10/16/2014  . Abnormal weight gain 10/16/2014  . Obesity, Class III, BMI 40-49.9 (morbid obesity) (HCC) 08/21/2013  . Onychomycosis 08/21/2013  . Severe obesity (BMI >= 40) (HCC) 10/24/2012  . Hyperlipidemia 10/22/2011  . HTN (hypertension) 10/22/2011  . PCOS (polycystic ovarian syndrome) 10/22/2011  . Hypothyroidism 11/10/2010    Roderic Scarce PT 01/27/2016, 10:19 AM  [facility] [address] [place], [place], [address] Phone: [phone]   Fax:  [phone]  Name: MURPHEE TOUPIN MRN: [id number] Date of Birth: 1965/07/21

## 2016-01-29 ENCOUNTER — Ambulatory Visit (INDEPENDENT_AMBULATORY_CARE_PROVIDER_SITE_OTHER): Payer: BLUE CROSS/BLUE SHIELD | Admitting: Rehabilitative and Restorative Service Providers"

## 2016-01-29 ENCOUNTER — Encounter: Payer: Self-pay | Admitting: Rehabilitative and Restorative Service Providers"

## 2016-01-29 DIAGNOSIS — M6281 Muscle weakness (generalized): Secondary | ICD-10-CM

## 2016-01-29 DIAGNOSIS — M25571 Pain in right ankle and joints of right foot: Secondary | ICD-10-CM

## 2016-01-29 DIAGNOSIS — M25671 Stiffness of right ankle, not elsewhere classified: Secondary | ICD-10-CM | POA: Diagnosis not present

## 2016-01-29 NOTE — Therapy (Signed)
[facility] [place] [address] [place], [place], [address] Phone: [phone]   Fax:  [phone]  Physical Therapy Treatment  Patient Details  Name: Kelly Reeves MRN: [id number] Date of Birth: [birth year] Referring Provider: Dr Karie Schwalbe  Encounter Date: 01/29/2016      PT End of Session - 01/29/16 1334    Visit Number 4   Number of Visits 8   Date for PT Re-Evaluation 02/10/16   PT Start Time 1334   PT Stop Time 1405   PT Time Calculation (min) 31 min   Activity Tolerance Patient tolerated treatment well      Past Medical History:  Diagnosis Date  . Hyperlipidemia   . Hypertension   . PCOS (polycystic ovarian syndrome)   . Thyroid disease     Past Surgical History:  Procedure Laterality Date  . CESAREAN SECTION  01/31/00    There were no vitals filed for this visit.      Subjective Assessment - 01/29/16 1336    Subjective Feeling much better. Can't believe how much better the ankle feels.    Currently in Pain? No/denies                         [facility] Adult PT Treatment/Exercise - 01/29/16 0001      Self-Care   Self-Care Other Self-Care Comments   Other Self-Care Comments  wlaked 1/4 mile and walked up steps step over step at home and at work      Exercises   Exercises Ankle     Ankle Exercises: Stretches   Soleus Stretch 2 reps;30 seconds   Gastroc Stretch 2 reps;30 seconds     Ankle Exercises: Aerobic   Stationary Bike L5 x 4'     Ankle Exercises: Standing   Vector Stance Limitations rebounder with blue therapad at 10 and 2 o'clock positions for ball toss on rebounder x 20    SLS at rebounder, red ball tosses, on firm surface, then on blue therapad  SLS gray therapad Rt 20-30 sec trials x 5    Rebounder walking jogging bouncing SLS 4 min total time alternating tasks    Other Standing Ankle Exercises step ups side ways on half foam roll with Rt with foam roll up and down - 10 reps 2sets     Other Standing Ankle Exercises side stepping on half bolster x 10 reps each side; Bosu ball side step up and down both directions x 20; forward touch to chair straight x 20 SLS Rt; fwd touch Lt UE to Rt side of chair x 10 SLS Rt                      PT Long Term Goals - 01/27/16 0954      PT LONG TERM GOAL #1   Title I with HEP ( 02/10/16)    Time 4   Period Weeks   Status On-going     PT LONG TERM GOAL #2   Title reduce FOTO =/< 32% limited ( 02/10/16)    Time 4   Period Weeks   Status On-going     PT LONG TERM GOAL #3   Title report =/> 75% improvement in feelings of ankle stability ( 02/10/16)    Time 4   Period Weeks   Status Achieved  80% improved with daily activity, hasn't put herself on challenging terrain yet     PT LONG TERM GOAL #  4   Title increase strength Rt knee/ankle =/> 5-/5 to be able to alternate on steps without difficulty ( 02/10/16)    Time 4   Period Weeks   Status On-going     PT LONG TERM GOAL #5   Title improve Rt ankle dorsiflexion =/> 15 degrees ( 02/10/16)    Time 4   Period Weeks   Status On-going             Patient will benefit from skilled therapeutic intervention in order to improve the following deficits and impairments:     Visit Diagnosis: Pain in right ankle and joints of right foot  Stiffness of right ankle, not elsewhere classified  Muscle weakness (generalized)     Problem List Patient Active Problem List   Diagnosis Date Noted  . Depression 01/13/2016  . Gallstones 12/30/2015  . Right ankle pain 12/29/2015  . Prediabetes 12/18/2015  . Elevated liver enzymes 12/18/2015  . Rash and nonspecific skin eruption 07/15/2015  . Obesity 10/16/2014  . Thyroid activity decreased 10/16/2014  . Abnormal weight gain 10/16/2014  . Obesity, Class III, BMI 40-49.9 (morbid obesity) (HCC) 08/21/2013  . Onychomycosis 08/21/2013  . Severe obesity (BMI >= 40) (HCC) 10/24/2012  . Hyperlipidemia 10/22/2011  . HTN  (hypertension) 10/22/2011  . PCOS (polycystic ovarian syndrome) 10/22/2011  . Hypothyroidism 11/10/2010    Celyn Rober Minion PT, MPH  01/29/2016, 2:29 PM  [facility] [address] [place], [place], [address] Phone: [phone]   Fax:  [phone]  Name: NAZLI BIRTCHER MRN: [id number] Date of Birth: 09-12-1965

## 2016-02-03 ENCOUNTER — Ambulatory Visit (INDEPENDENT_AMBULATORY_CARE_PROVIDER_SITE_OTHER): Payer: BLUE CROSS/BLUE SHIELD | Admitting: Physical Therapy

## 2016-02-03 DIAGNOSIS — M25671 Stiffness of right ankle, not elsewhere classified: Secondary | ICD-10-CM

## 2016-02-03 DIAGNOSIS — M6281 Muscle weakness (generalized): Secondary | ICD-10-CM

## 2016-02-03 NOTE — Therapy (Signed)
[place], [place], [address] Phone: [phone]   Fax:  [phone]  Physical Therapy Treatment  Patient Details  Name: Kelly Reeves MRN: [id number] Date of Birth: May 02, 1966 Referring Provider: Dr. Pollyann Glen  Encounter Date: 02/03/2016      PT End of Session - 02/03/16 1651    Visit Number 5   Number of Visits 8   Date for PT Re-Evaluation 02/10/16   PT Start Time 1602   PT Stop Time 1642   PT Time Calculation (min) 40 min   Activity Tolerance Patient tolerated treatment well;No increased pain      Past Medical History:  Diagnosis Date  . Hyperlipidemia   . Hypertension   . PCOS (polycystic ovarian syndrome)   . Thyroid disease     Past Surgical History:  Procedure Laterality Date  . CESAREAN SECTION  01/31/00    There were no vitals filed for this visit.          [facility] PT Assessment - 02/03/16 0001      Assessment   Medical Diagnosis Rt ankle pain   Referring Provider Dr. Pollyann Glen   Onset Date/Surgical Date 03/16/15   Hand Dominance Right   Next MD Visit 02/17/16     Strength   Right Ankle Dorsiflexion 5/5   Right Ankle Plantar Flexion 4/5  10 single heel raises   Right Ankle Inversion 5/5   Right Ankle Eversion 5/5         [facility] Adult PT Treatment/Exercise - 02/03/16 0001      Exercises   Exercises Ankle     Ankle Exercises: Stretches   Plantar Fascia Stretch 2 reps;30 seconds   Soleus Stretch 2 reps;30 seconds   Gastroc Stretch 2 reps;30 seconds;20 seconds     Ankle Exercises: Aerobic   Stationary Bike NuStep L5: 6 min      Ankle Exercises: Standing   SLS Rt forward leans to chair height x 12 reps (VC for form, increase height of LLE, and slow speed)    Heel Raises --  single leg - each 10 reps (Rt heel 1/2" less lift than Lt)   Other Standing Ankle Exercises stairs - reciprocal pattern up/ down with single UE support (~50 steps)   Other  Standing Ankle Exercises Step down with LLE on 3" step, reverse step up with RLE x 15 reps (VC for foot alignment and mirror for visual feedback) x 15; repeated with Lt heel taps to 3" cup x 10.                       PT Long Term Goals - 02/03/16 1650      PT LONG TERM GOAL #1   Title I with HEP ( 02/10/16)    Time 4   Period Weeks   Status On-going     PT LONG TERM GOAL #2   Title reduce FOTO =/< 32% limited ( 02/10/16)    Time 4   Period Weeks   Status On-going     PT LONG TERM GOAL #3   Title report =/> 75% improvement in feelings of ankle stability ( 02/10/16)    Time 4   Period Weeks   Status Achieved     PT LONG TERM GOAL #4   Title increase strength Rt knee/ankle =/> 5-/5 to be able to alternate on steps without difficulty ( 02/10/16)    Time 4   Period Weeks  Status Partially Met     PT LONG TERM GOAL #5   Title improve Rt ankle dorsiflexion =/> 15 degrees ( 02/10/16)    Time 4   Period Weeks   Status On-going               Plan - 02/03/16 1651    Clinical Impression Statement Pt able to tolerate reciprocal pattern on steps with practice.  She tolerated all exercises without any production of symptoms.  Making good gains towards unmet goals.    Rehab Potential Excellent   PT Frequency 2x / week   PT Duration 4 weeks   PT Treatment/Interventions Ultrasound;Neuromuscular re-education;Patient/family education;Gait training;Cryotherapy;Stair training;Dry needling;Electrical Stimulation;Iontophoresis 55m/ml Dexamethasone;Moist Heat;Therapeutic exercise;Manual techniques;Taping   PT Next Visit Plan see how new HEP is, progress to walking on uneven surfaces.  See if she practiced walking in her yard   Consulted and Agree with Plan of Care Patient      Patient will benefit from skilled therapeutic intervention in order to improve the following deficits and impairments:  Postural dysfunction, Increased edema, Decreased strength, Hypomobility, Decreased range  of motion, Difficulty walking  Visit Diagnosis: Stiffness of right ankle, not elsewhere classified  Muscle weakness (generalized)     Problem List Patient Active Problem List   Diagnosis Date Noted  . Depression 01/13/2016  . Gallstones 12/30/2015  . Right ankle pain 12/29/2015  . Prediabetes 12/18/2015  . Elevated liver enzymes 12/18/2015  . Rash and nonspecific skin eruption 07/15/2015  . Obesity 10/16/2014  . Thyroid activity decreased 10/16/2014  . Abnormal weight gain 10/16/2014  . Obesity, Class III, BMI 40-49.9 (morbid obesity) ([facility] 08/21/2013  . Onychomycosis 08/21/2013  . Severe obesity (BMI >= 40) ([facility] 10/24/2012  . Hyperlipidemia 10/22/2011  . HTN (hypertension) 10/22/2011  . PCOS (polycystic ovarian syndrome) 10/22/2011  . Hypothyroidism 11/10/2010   JKerin Perna PTA 02/03/16 4:54 PM  [place] [address]Phone: [phone]  Fax:  [phone] Name: Kelly GAUGERMRN: [id number] of Birth: 203/01/1966

## 2016-02-05 ENCOUNTER — Ambulatory Visit (INDEPENDENT_AMBULATORY_CARE_PROVIDER_SITE_OTHER): Payer: BLUE CROSS/BLUE SHIELD | Admitting: Physical Therapy

## 2016-02-05 DIAGNOSIS — M6281 Muscle weakness (generalized): Secondary | ICD-10-CM | POA: Diagnosis not present

## 2016-02-05 DIAGNOSIS — M25671 Stiffness of right ankle, not elsewhere classified: Secondary | ICD-10-CM | POA: Diagnosis not present

## 2016-02-05 NOTE — Therapy (Addendum)
[place], [place], [address] Phone: [phone]   Fax:  [phone]  Physical Therapy Treatment  Patient Details  Name: Kelly Reeves MRN: [id number] Date of Birth: 13-Aug-1965 Referring Provider: Dr. Helane Rima  Encounter Date: 02/05/2016      PT End of Session - 02/05/16 1110    Visit Number 6   Number of Visits 8   Date for PT Re-Evaluation 02/10/16   PT Start Time 1110  pt arrived late   PT Stop Time 1148   PT Time Calculation (min) 38 min   Activity Tolerance Patient tolerated treatment well;No increased pain   Behavior During Therapy WFL for tasks assessed/performed      Past Medical History:  Diagnosis Date  . Hyperlipidemia   . Hypertension   . PCOS (polycystic ovarian syndrome)   . Thyroid disease     Past Surgical History:  Procedure Laterality Date  . CESAREAN SECTION  01/31/00    There were no vitals filed for this visit.      Subjective Assessment - 02/05/16 1111    Subjective Pt reports she has walked in her yard (uneven surface). She feels her motion and strength in ankle has improved.  She was a little sore in knees from stair exercises last visit.     Currently in Pain? No/denies            [facility] PT Assessment - 02/05/16 0001      Assessment   Medical Diagnosis Rt ankle pain   Referring Provider Dr. Helane Rima   Onset Date/Surgical Date 03/16/15   Hand Dominance Right   Next MD Visit 02/17/16     Observation/Other Assessments   Focus on Therapeutic Outcomes (FOTO)  11% limited      AROM   Right Ankle Dorsiflexion 12   Right Ankle Plantar Flexion 54   Right Ankle Inversion 36   Right Ankle Eversion 38     PROM   Right/Left Ankle Left;Right   Right Ankle Dorsiflexion 17  heels hanging off of step.    Left Ankle Dorsiflexion 16     Strength   Right Ankle Dorsiflexion 5/5   Right Ankle Plantar Flexion 5/5  20 RLE heel raises   Right Ankle  Inversion 5/5   Right Ankle Eversion 5/5           [facility] Adult PT Treatment/Exercise - 02/05/16 0001      Ankle Exercises: Stretches   Plantar Fascia Stretch 2 reps;30 seconds   Soleus Stretch 2 reps;30 seconds   Gastroc Stretch 2 reps;30 seconds   Other Stretch Quad stretch in prone with strap x 30 sec x 3 reps each      Ankle Exercises: Aerobic   Stationary Bike NuStep L5: 7 min      Ankle Exercises: Standing   SLS Rt/Lt SLS on Bosu x 30 sec each leg. Rt/Lt SLS with eyes closed, horiz head turns, vertical head turns x 2 reps each.   Heel Raises 20 reps  RLE   Warrior II x 5 reps Rt/Lt     Other Standing Ankle Exercises Heel taps on 3" step x 10 each leg, VC for form.             PT Long Term Goals - 02/05/16 1128      PT LONG TERM GOAL #1   Title I with HEP ( 02/10/16)    Time 4   Period Weeks   Status Achieved  PT LONG TERM GOAL #2   Title reduce FOTO =/< 32% limited ( 02/10/16)    Time 4   Period Weeks   Status Achieved     PT LONG TERM GOAL #3   Title report =/> 75% improvement in feelings of ankle stability ( 02/10/16)    Time 4   Status Achieved     PT LONG TERM GOAL #4   Title increase strength Rt knee/ankle =/> 5-/5 to be able to alternate on steps without difficulty ( 02/10/16)    Time 4   Period Weeks   Status Achieved     PT LONG TERM GOAL #5   Title improve Rt ankle dorsiflexion =/> 15 degrees ( 02/10/16)    Time 4   Period Weeks   Status Achieved               Plan - 02/05/16 1155    Clinical Impression Statement Pt tolerated all exercises well without any production of pain or instability. She has met all goals and requests to d/c at this time.    Rehab Potential Excellent   PT Frequency 2x / week   PT Duration 4 weeks   PT Treatment/Interventions Ultrasound;Neuromuscular re-education;Patient/family education;Gait training;Cryotherapy;Stair training;Dry needling;Electrical Stimulation;Iontophoresis 76m/ml Dexamethasone;Moist  Heat;Therapeutic exercise;Manual techniques;Taping   PT Next Visit Plan Spoke to supervising PT; will d/c to HEP at this time.    Consulted and Agree with Plan of Care Patient      Patient will benefit from skilled therapeutic intervention in order to improve the following deficits and impairments:  Postural dysfunction, Increased edema, Decreased strength, Hypomobility, Decreased range of motion, Difficulty walking  Visit Diagnosis: Stiffness of right ankle, not elsewhere classified  Muscle weakness (generalized)     Problem List Patient Active Problem List   Diagnosis Date Noted  . Depression 01/13/2016  . Gallstones 12/30/2015  . Right ankle pain 12/29/2015  . Prediabetes 12/18/2015  . Elevated liver enzymes 12/18/2015  . Rash and nonspecific skin eruption 07/15/2015  . Obesity 10/16/2014  . Thyroid activity decreased 10/16/2014  . Abnormal weight gain 10/16/2014  . Obesity, Class III, BMI 40-49.9 (morbid obesity) ([facility] 08/21/2013  . Onychomycosis 08/21/2013  . Severe obesity (BMI >= 40) ([facility] 10/24/2012  . Hyperlipidemia 10/22/2011  . HTN (hypertension) 10/22/2011  . PCOS (polycystic ovarian syndrome) 10/22/2011  . Hypothyroidism 11/10/2010   JKerin Perna PTA 02/05/16 12:09 PM  [place] [address]Phone: [phone]  Fax:  [phone] Name: LCRUCITA LACORTEMRN: [id number] of Birth: 210-12-1965  PHYSICAL THERAPY DISCHARGE SUMMARY  Visits from Start of Care: 6  Current functional level related to goals / functional outcomes: See above   Remaining deficits: none   Education / Equipment: HEP  Plan: Patient agrees to discharge.  Patient goals were met. Patient is being discharged due to meeting the stated rehab goals.  ?????    SJeral Pinch PT 02/10/16 7:45 AM

## 2016-02-05 NOTE — Patient Instructions (Signed)
Warrior II    In wide stance, arms extended out, rotate right leg out 90, left leg in 20. Bend right leg 90 in line with foot. Keep left foot flat, hips square to front. Turn head right. Hold for __10__ breaths. Repeat on other side. NOTE: Keep bent knee behind line of toes.   KNEE: Quadriceps - Prone    Place strap around ankle. Bring ankle toward buttocks. Press hip into surface. Hold __30_ seconds. _2__ reps per set, _1-2__ sets per day, _5__ days per week   POSITION: Single Leg Balance: Neck Rotated / Stance Side    Stand on right leg. Rotate head to stance side.  Then look up/ down.   __2-3_ reps __1_ times per day.   [facility] [address] [place], [place] [address]  [phone] (office) [phone] (fax)

## 2016-02-13 ENCOUNTER — Other Ambulatory Visit: Payer: Self-pay | Admitting: Physician Assistant

## 2016-02-17 ENCOUNTER — Ambulatory Visit (INDEPENDENT_AMBULATORY_CARE_PROVIDER_SITE_OTHER): Payer: BLUE CROSS/BLUE SHIELD | Admitting: Sports Medicine

## 2016-02-17 ENCOUNTER — Other Ambulatory Visit: Payer: Self-pay | Admitting: Physician Assistant

## 2016-02-17 ENCOUNTER — Encounter: Payer: Self-pay | Admitting: Physician Assistant

## 2016-02-17 ENCOUNTER — Ambulatory Visit (INDEPENDENT_AMBULATORY_CARE_PROVIDER_SITE_OTHER): Payer: BLUE CROSS/BLUE SHIELD | Admitting: Physician Assistant

## 2016-02-17 VITALS — BP 136/82 | HR 94 | Ht 64.0 in | Wt 253.0 lb

## 2016-02-17 DIAGNOSIS — E669 Obesity, unspecified: Secondary | ICD-10-CM | POA: Diagnosis not present

## 2016-02-17 DIAGNOSIS — E039 Hypothyroidism, unspecified: Secondary | ICD-10-CM

## 2016-02-17 DIAGNOSIS — M25571 Pain in right ankle and joints of right foot: Secondary | ICD-10-CM | POA: Diagnosis not present

## 2016-02-17 DIAGNOSIS — N62 Hypertrophy of breast: Secondary | ICD-10-CM

## 2016-02-17 LAB — T4, FREE: Free T4: 1.5 ng/dL (ref 0.8–1.8)

## 2016-02-17 MED ORDER — BUPROPION HCL ER (XL) 150 MG PO TB24: 150.0000 mg | ORAL_TABLET | Freq: Every day | ORAL | 5 refills | Status: DC

## 2016-02-17 NOTE — Progress Notes (Signed)
   Subjective:    Patient ID: Kelly Reeves, female    DOB: August 05, 1965, 50 y.o.   MRN: [id number]  HPI  Patient is a 50 year old female who presents to the clinic to follow-up on weight loss. In the last month she is down another 6 pounds. She has not taken the phentermine. She has taken Wellbutrin 150 mg and her Synthroid. She has not had her thyroid checked since she started Synthroid.    Review of Systems See HPI.     Objective:   Physical Exam  Constitutional: She is oriented to person, place, and time. She appears well-developed and well-nourished.  Obesity.   Cardiovascular: Normal rate, regular rhythm and normal heart sounds.   Neurological: She is alert and oriented to person, place, and time.  Psychiatric: She has a normal mood and affect. Her behavior is normal.          Assessment & Plan:  Obesity-has lost 6 poundsIn the last month. Discussed with patient to stay off phentermine since she is losing weight on her own. Continue Wellbutrin 150 mg daily. Refill sent for 6 months. She also finds this is helping with her overall mood. Continue walking daily and watching diet. I would like for patient to consider breast reduction.  Large breasts-I do think that a breast reduction would further help her lose weight and improve her mobility. Will make referral today.  Hypothyroidism-we'll recheck TSH and free T4 today. Will make adjustments as needed.

## 2016-02-17 NOTE — Progress Notes (Signed)
  Subjective:    CC: Follow-up  HPI: Right ankle instability: Completely resolved with formal physical therapy, no complaints.  Past medical history, Surgical history, Family history not pertinant except as noted below, Social history, Allergies, and medications have been entered into the medical record, reviewed, and no changes needed.   Review of Systems: No fevers, chills, night sweats, weight loss, chest pain, or shortness of breath.   Objective:    General: Well Developed, well nourished, and in no acute distress.  Neuro: Alert and oriented x3, extra-ocular muscles intact, sensation grossly intact.  HEENT: Normocephalic, atraumatic, pupils equal round reactive to light, neck supple, no masses, no lymphadenopathy, thyroid nonpalpable.  Skin: Warm and dry, no rashes. Cardiac: Regular rate and rhythm, no murmurs rubs or gallops, no lower extremity edema.  Respiratory: Clear to auscultation bilaterally. Not using accessory muscles, speaking in full sentences. Right Ankle: No visible erythema or swelling. Range of motion is full in all directions. Strength is 5/5 in all directions. Stable lateral and medial ligaments; squeeze test and kleiger test unremarkable; Talar dome nontender; No pain at base of 5th MT; No tenderness over cuboid; No tenderness over N spot or navicular prominence No tenderness on posterior aspects of lateral and medial malleolus No sign of peroneal tendon subluxations; Negative tarsal tunnel tinel's Able to walk 4 steps.  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 She worked aggressively with physical therapy on her dynamic stabilizers, and she returns today pain-free. She will continue the exercises intermittently and return for custom orthotics if persistent pain. She can return as needed.

## 2016-02-17 NOTE — Assessment & Plan Note (Signed)
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 She worked aggressively with physical therapy on her dynamic stabilizers, and she returns today pain-free. She will continue the exercises intermittently and return for custom orthotics if persistent pain. She can return as needed.

## 2016-02-18 LAB — TSH: TSH: 1.2 m[IU]/L

## 2016-02-19 ENCOUNTER — Other Ambulatory Visit: Payer: Self-pay | Admitting: *Deleted

## 2016-02-19 MED ORDER — LEVOTHYROXINE SODIUM 112 MCG PO TABS: ORAL_TABLET | ORAL | 5 refills | Status: DC

## 2016-04-04 ENCOUNTER — Other Ambulatory Visit: Payer: Self-pay | Admitting: Physician Assistant

## 2016-05-18 ENCOUNTER — Ambulatory Visit (INDEPENDENT_AMBULATORY_CARE_PROVIDER_SITE_OTHER): Payer: BLUE CROSS/BLUE SHIELD | Admitting: Physician Assistant

## 2016-05-18 ENCOUNTER — Encounter: Payer: Self-pay | Admitting: Physician Assistant

## 2016-05-18 VITALS — BP 134/82 | HR 98 | Ht 64.0 in | Wt 241.0 lb

## 2016-05-18 DIAGNOSIS — R635 Abnormal weight gain: Secondary | ICD-10-CM | POA: Diagnosis not present

## 2016-05-18 DIAGNOSIS — Z23 Encounter for immunization: Secondary | ICD-10-CM | POA: Diagnosis not present

## 2016-05-18 MED ORDER — PHENTERMINE HCL 37.5 MG PO TABS: 37.5000 mg | ORAL_TABLET | Freq: Every day | ORAL | 0 refills | Status: DC

## 2016-05-18 NOTE — Progress Notes (Signed)
   Subjective:    Patient ID: Kelly Reeves, female    DOB: [birth year], 50 y.o.   MRN: [id number]  HPI  Pt is a 50 yo female who presents to the clinic to follow up on weight loss. She has lost 12lbs since August 2017 and 30lbs down from highest weight. She just started phentermine about 2 weeks ago. She has made major diet changes and counting her calories. She has eliminated breads, soft drinks and desserts out of diet. She admits to not exercising. She wanted to wait until she plateu to start phentermine. She denies any palpitations, headaches, dizziness, dry mouth, constipation, or insomnia.  She has consulted with plastic surgery for breast reduction.    Review of Systems  All other systems reviewed and are negative.      Objective:   Physical Exam  Constitutional: She is oriented to person, place, and time. She appears well-developed and well-nourished.  Morbid obesity  HENT:  Head: Normocephalic and atraumatic.  Cardiovascular: Normal rate, regular rhythm and normal heart sounds.   Pulmonary/Chest: Effort normal and breath sounds normal.  Neurological: She is alert and oriented to person, place, and time.  Psychiatric: She has a normal mood and affect. Her behavior is normal.          Assessment & Plan:  [name].[name].[name] was seen today for obesity and hypothyroidism.  Diagnoses and all orders for this visit:  Abnormal weight gain -     phentermine (ADIPEX-P) 37.5 MG tablet; Take 1 tablet (37.5 mg total) by mouth daily before breakfast. Do not refill until 06/17/16.  Influenza vaccine needed -     Flu Vaccine QUAD 36+ mos PF IM (Fluarix & Fluzone Quad PF)  Morbid obesity (HCC) -     phentermine (ADIPEX-P) 37.5 MG tablet; Take 1 tablet (37.5 mg total) by mouth daily before breakfast. Do not refill until 06/17/16.   Vitals great. Continue phentermine.  Continue diet and increase exercise at least 150 minutes a week.  Encouraged breast reduction to increase exercise potential  and strain on neck and back and shoulders.  BMI almost out of morbid obesity range.

## 2016-07-13 ENCOUNTER — Ambulatory Visit: Payer: BLUE CROSS/BLUE SHIELD | Admitting: Physician Assistant

## 2016-08-03 ENCOUNTER — Encounter: Payer: Self-pay | Admitting: Physician Assistant

## 2016-08-03 ENCOUNTER — Ambulatory Visit (INDEPENDENT_AMBULATORY_CARE_PROVIDER_SITE_OTHER): Payer: BLUE CROSS/BLUE SHIELD | Admitting: Physician Assistant

## 2016-08-03 VITALS — BP 124/81 | HR 105 | Temp 97.9°F | Ht 64.0 in | Wt 245.0 lb

## 2016-08-03 DIAGNOSIS — J069 Acute upper respiratory infection, unspecified: Secondary | ICD-10-CM

## 2016-08-03 DIAGNOSIS — B9789 Other viral agents as the cause of diseases classified elsewhere: Secondary | ICD-10-CM | POA: Diagnosis not present

## 2016-08-03 MED ORDER — BENZONATATE 200 MG PO CAPS: 200.0000 mg | ORAL_CAPSULE | Freq: Two times a day (BID) | ORAL | 1 refills | Status: DC | PRN

## 2016-08-03 NOTE — Patient Instructions (Signed)
Upper Respiratory Infection, Adult Most upper respiratory infections (URIs) are caused by a virus. A URI affects the nose, throat, and upper air passages. The most common type of URI is often called "the common cold." Follow these instructions at home:  Take medicines only as told by your doctor.  Gargle warm saltwater or take cough drops to comfort your throat as told by your doctor.  Use a warm mist humidifier or inhale steam from a shower to increase air moisture. This may make it easier to breathe.  Drink enough fluid to keep your pee (urine) clear or pale yellow.  Eat soups and other clear broths.  Have a healthy diet.  Rest as needed.  Go back to work when your fever is gone or your doctor says it is okay.  You may need to stay home longer to avoid giving your URI to others.  You can also wear a face mask and wash your hands often to prevent spread of the virus.  Use your inhaler more if you have asthma.  Do not use any tobacco products, including cigarettes, chewing tobacco, or electronic cigarettes. If you need help quitting, ask your doctor. Contact a doctor if:  You are getting worse, not better.  Your symptoms are not helped by medicine.  You have chills.  You are getting more short of breath.  You have brown or red mucus.  You have yellow or brown discharge from your nose.  You have pain in your face, especially when you bend forward.  You have a fever.  You have puffy (swollen) neck glands.  You have pain while swallowing.  You have white areas in the back of your throat. Get help right away if:  You have very bad or constant:  Headache.  Ear pain.  Pain in your forehead, behind your eyes, and over your cheekbones (sinus pain).  Chest pain.  You have long-lasting (chronic) lung disease and any of the following:  Wheezing.  Long-lasting cough.  Coughing up blood.  A change in your usual mucus.  You have a stiff neck.  You have  changes in your:  Vision.  Hearing.  Thinking.  Mood. This information is not intended to replace advice given to you by your health care provider. Make sure you discuss any questions you have with your health care provider. Document Released: 12/07/2007 Document Revised: 02/21/2016 Document Reviewed: 09/25/2013 Elsevier Interactive Patient Education  2017 [employer]  

## 2016-08-03 NOTE — Progress Notes (Addendum)
   Subjective:    Patient ID: Kelly Reeves, female    DOB: [birth year], 51 y.o.   MRN: [id number]  HPI  Patient is a 51yo female who presents to the clinic with a sore throat, cough, and sinus congestion for the past 3 days.  She denies any ear pain, body aches, fever, or chills.  Denies any abdominal pain, nausea, vomiting, or diarrhea  Patient reports she has been around sick contacts with similar symptoms at work. She reports she has been taking Delsym for cough and ibuprofen for symptoms.  She states she did receive the flu shot.  Patient states tessalon pearls helped a lot in the past.     Review of Systems  HENT: Positive for congestion and sore throat.   Respiratory: Positive for cough.   All other systems reviewed and are negative.      Objective:   Physical Exam  Constitutional: She is oriented to person, place, and time. She appears well-developed and well-nourished.  HENT:  Head: Normocephalic and atraumatic.  Right Ear: External ear normal.  Left Ear: External ear normal.  Mouth/Throat: Oropharynx is clear and moist.  Eyes: Conjunctivae are normal.  Cardiovascular: Normal rate, regular rhythm and normal heart sounds.   Pulmonary/Chest: Effort normal and breath sounds normal.  Neurological: She is alert and oriented to person, place, and time.  Skin: Skin is warm and dry.  Psychiatric: She has a normal mood and affect. Her behavior is normal.  Nursing note and vitals reviewed.         Assessment & Plan:  [name].[name].Kelly Reeves was seen today for headache, sore throat and cough.  Diagnoses and all orders for this visit:  Viral URI with cough -     benzonatate (TESSALON) 200 MG capsule; Take 1 capsule (200 mg total) by mouth 2 (two) times daily as needed for cough.  Patient likely has a viral URI with a cough.  She was given Tessalon 200mg  for cough relief.  Patient instructed to rest and increase oral intake.  Suggested she stay home from work tomorrow to rest.    Follow-up  if worsening or not improving.

## 2016-08-05 ENCOUNTER — Telehealth: Payer: Self-pay | Admitting: *Deleted

## 2016-08-05 ENCOUNTER — Other Ambulatory Visit: Payer: Self-pay | Admitting: Physician Assistant

## 2016-08-05 MED ORDER — AMOXICILLIN-POT CLAVULANATE 875-125 MG PO TABS: 1.0000 | ORAL_TABLET | Freq: Two times a day (BID) | ORAL | 0 refills | Status: DC

## 2016-08-05 NOTE — Telephone Encounter (Signed)
Pt notified of rx & to increase cough medicine to TID.

## 2016-08-05 NOTE — Telephone Encounter (Signed)
Pt left vm stating that she isn't any better & wanted to know if you would send her an abx in.  She also wanted to know if she could take the cough medicine more that BID.  Her husband is now sick as well & wanted to know what needs to be done for him.  Please advise.

## 2016-08-05 NOTE — Telephone Encounter (Signed)
Sent augmentin for 10 days. Still could be viral since husband has it to. Follow up as needed. Can increase tessalon to three times a day.

## 2016-08-06 ENCOUNTER — Other Ambulatory Visit: Payer: Self-pay | Admitting: Physician Assistant

## 2016-08-08 MED ORDER — LEVOTHYROXINE SODIUM 112 MCG PO TABS: 112.0000 ug | ORAL_TABLET | Freq: Every day | ORAL | 0 refills | Status: DC

## 2016-08-08 NOTE — Telephone Encounter (Signed)
RX for levothyroxine is printing rather than being sent electronically, despite changing status to "normal".  Please address since I am not located in JaneJ[facility]'s office.  Tiajuana Amass. Nelson, CMA

## 2016-08-10 ENCOUNTER — Telehealth: Payer: Self-pay

## 2016-08-10 ENCOUNTER — Other Ambulatory Visit: Payer: Self-pay

## 2016-08-10 ENCOUNTER — Ambulatory Visit: Payer: BLUE CROSS/BLUE SHIELD | Admitting: Physician Assistant

## 2016-08-10 MED ORDER — PREDNISONE 50 MG PO TABS: ORAL_TABLET | ORAL | 0 refills | Status: DC

## 2016-08-10 NOTE — Telephone Encounter (Signed)
Sounds like could be some eustachian tube dysfunction(fluid in ears). Prednisone can help with this. Would you like for me to send to pharmacy?

## 2016-08-10 NOTE — Telephone Encounter (Signed)
Called Pt and informed her that she could come in today to get a Prednisone injection that can be tolerated better. Pt stated that she did not have a ride and wanted prednisone as a prescription. Will send Rx to pharmacy.

## 2016-08-10 NOTE — Telephone Encounter (Signed)
As long as she understands it is prednisone. She also could come into office for injection of prednisone that can be tolerated better.

## 2016-08-10 NOTE — Telephone Encounter (Signed)
Pt called stating that when she woke up this morning and stood up she felt dizzy and started to feel nauseous. Pt stated she was here last week and was diagnosed with an URI. Pt stated she is taking Augmentin and started it on Sat (08/06/16). Pt states she has Right ear pain. Pt denies Shortness of breath or chest pain. Please advise?

## 2016-08-10 NOTE — Telephone Encounter (Signed)
Pt stated she does want Prednisone.

## 2016-08-10 NOTE — Telephone Encounter (Signed)
Ok to send prednisone 50mg  once a day for 5 days. #5 NRF

## 2016-08-10 NOTE — Telephone Encounter (Signed)
Just to clarify I noticed prednisone was listed under the pt's allergies on chart. Called pt regarding prednisone allergy. Pt stated that she had taken prednisone not too long ago and did not have any problems with it. Do you still want me to send it to pharmacy?

## 2016-08-11 ENCOUNTER — Other Ambulatory Visit: Payer: Self-pay | Admitting: Physician Assistant

## 2016-09-01 ENCOUNTER — Other Ambulatory Visit: Payer: Self-pay | Admitting: Physician Assistant

## 2016-09-05 ENCOUNTER — Other Ambulatory Visit: Payer: Self-pay | Admitting: Physician Assistant

## 2016-10-10 ENCOUNTER — Other Ambulatory Visit: Payer: Self-pay | Admitting: Physician Assistant

## 2016-10-21 ENCOUNTER — Other Ambulatory Visit: Payer: Self-pay | Admitting: Physician Assistant

## 2016-11-11 ENCOUNTER — Other Ambulatory Visit: Payer: Self-pay | Admitting: *Deleted

## 2016-11-11 MED ORDER — LEVOTHYROXINE SODIUM 112 MCG PO TABS: ORAL_TABLET | ORAL | 0 refills | Status: DC

## 2016-11-14 ENCOUNTER — Other Ambulatory Visit: Payer: Self-pay | Admitting: Physician Assistant

## 2016-12-12 ENCOUNTER — Other Ambulatory Visit: Payer: Self-pay | Admitting: Obstetrics and Gynecology

## 2016-12-12 DIAGNOSIS — Z1231 Encounter for screening mammogram for malignant neoplasm of breast: Secondary | ICD-10-CM

## 2016-12-19 ENCOUNTER — Other Ambulatory Visit: Payer: Self-pay | Admitting: Physician Assistant

## 2016-12-28 ENCOUNTER — Ambulatory Visit
Admission: RE | Admit: 2016-12-28 | Discharge: 2016-12-28 | Disposition: A | Discharge status: Home / Self Care | Payer: BLUE CROSS/BLUE SHIELD | Source: Ambulatory Visit | Attending: Obstetrics and Gynecology | Admitting: Obstetrics and Gynecology

## 2016-12-28 DIAGNOSIS — Z1231 Encounter for screening mammogram for malignant neoplasm of breast: Secondary | ICD-10-CM

## 2016-12-30 ENCOUNTER — Other Ambulatory Visit: Payer: Self-pay | Admitting: *Deleted

## 2016-12-30 MED ORDER — HYDROCHLOROTHIAZIDE 25 MG PO TABS: 25.0000 mg | ORAL_TABLET | Freq: Every day | ORAL | 2 refills | Status: DC

## 2017-01-03 ENCOUNTER — Other Ambulatory Visit: Payer: Self-pay | Admitting: *Deleted

## 2017-01-03 MED ORDER — HYDROCHLOROTHIAZIDE 25 MG PO TABS: 25.0000 mg | ORAL_TABLET | Freq: Every day | ORAL | 2 refills | Status: DC

## 2017-01-23 ENCOUNTER — Other Ambulatory Visit: Payer: Self-pay | Admitting: Physician Assistant

## 2017-01-26 ENCOUNTER — Other Ambulatory Visit: Payer: Self-pay | Admitting: Physician Assistant

## 2017-02-10 ENCOUNTER — Other Ambulatory Visit: Payer: Self-pay | Admitting: Physician Assistant

## 2017-02-22 ENCOUNTER — Encounter: Payer: Self-pay | Admitting: Physician Assistant

## 2017-02-22 ENCOUNTER — Ambulatory Visit (INDEPENDENT_AMBULATORY_CARE_PROVIDER_SITE_OTHER): Payer: BLUE CROSS/BLUE SHIELD | Admitting: Physician Assistant

## 2017-02-22 DIAGNOSIS — R3989 Other symptoms and signs involving the genitourinary system: Secondary | ICD-10-CM

## 2017-02-22 DIAGNOSIS — I10 Essential (primary) hypertension: Secondary | ICD-10-CM | POA: Diagnosis not present

## 2017-02-22 DIAGNOSIS — E785 Hyperlipidemia, unspecified: Secondary | ICD-10-CM

## 2017-02-22 DIAGNOSIS — Z23 Encounter for immunization: Secondary | ICD-10-CM

## 2017-02-22 DIAGNOSIS — R7303 Prediabetes: Secondary | ICD-10-CM

## 2017-02-22 DIAGNOSIS — R748 Abnormal levels of other serum enzymes: Secondary | ICD-10-CM | POA: Diagnosis not present

## 2017-02-22 DIAGNOSIS — E039 Hypothyroidism, unspecified: Secondary | ICD-10-CM | POA: Diagnosis not present

## 2017-02-22 DIAGNOSIS — R319 Hematuria, unspecified: Secondary | ICD-10-CM | POA: Insufficient documentation

## 2017-02-22 LAB — CBC WITH DIFFERENTIAL/PLATELET
Basophils Absolute: 69 cells/uL (ref 0–200)
Basophils Relative: 1 %
EOS PCT: 2 %
Eosinophils Absolute: 138 cells/uL (ref 15–500)
HEMATOCRIT: 42.8 % (ref 35.0–45.0)
Hemoglobin: 14.5 g/dL (ref 11.7–15.5)
LYMPHS PCT: 34 %
Lymphs Abs: 2346 cells/uL (ref 850–3900)
MCH: 30.1 pg (ref 27.0–33.0)
MCHC: 33.9 g/dL (ref 32.0–36.0)
MCV: 89 fL (ref 80.0–100.0)
MONO ABS: 483 {cells}/uL (ref 200–950)
MPV: 9.3 fL (ref 7.5–12.5)
Monocytes Relative: 7 %
NEUTROS PCT: 56 %
Neutro Abs: 3864 cells/uL (ref 1500–7800)
Platelets: 457 10*3/uL — ABNORMAL HIGH (ref 140–400)
RBC: 4.81 MIL/uL (ref 3.80–5.10)
RDW: 13.9 % (ref 11.0–15.0)
WBC: 6.9 10*3/uL (ref 3.8–10.8)

## 2017-02-22 LAB — POCT URINALYSIS DIPSTICK
Bilirubin, UA: NEGATIVE
GLUCOSE UA: NEGATIVE
Ketones, UA: NEGATIVE
LEUKOCYTES UA: NEGATIVE
NITRITE UA: NEGATIVE
Protein, UA: NEGATIVE
Spec Grav, UA: 1.025 (ref 1.010–1.025)
Urobilinogen, UA: 0.2 E.U./dL
pH, UA: 6 (ref 5.0–8.0)

## 2017-02-22 LAB — TSH: TSH: 1.57 mIU/L

## 2017-02-22 NOTE — Progress Notes (Signed)
Subjective:    Patient ID: Kelly Reeves, female    DOB: February 25, 1966, 51 y.o.   MRN: [id number]  HPI  Pt is a 51 yo morbidly obese femal who presents to the clinic to follow up.   She has not been seen since January. She had lost 30lbs with diet and exercise but gained it all back in last 2 months. She is very frustrated with this but knows this is her own fought. She stopped counting her calories and did not stay active.   Pre-diabetes- she needs labs to reevaluate.   Hypothyroidism- she needs labs to reevaluate.   Hyperlipidemia- on livalo. Needs labs. Tolerating statin fine.   She has had some dark urine and made her concern. She would like evaluated today. No dysuria, fever.   .. Active Ambulatory Problems    Diagnosis Date Noted  . Hypothyroidism 11/10/2010  . Hyperlipidemia 10/22/2011  . HTN (hypertension) 10/22/2011  . PCOS (polycystic ovarian syndrome) 10/22/2011  . Severe obesity (BMI >= 40) ([place]) 10/24/2012  . Obesity, Class III, BMI 40-49.9 (morbid obesity) ([place]) 08/21/2013  . Onychomycosis 08/21/2013  . Obesity 10/16/2014  . Thyroid activity decreased 10/16/2014  . Abnormal weight gain 10/16/2014  . Rash and nonspecific skin eruption 07/15/2015  . Prediabetes 12/18/2015  . Elevated liver enzymes 12/18/2015  . Right ankle pain 12/29/2015  . Gallstones 12/30/2015  . Depression 01/13/2016  . Large breasts 02/17/2016  . Morbid obesity ([place]) 05/18/2016  . Hematuria 02/22/2017   Resolved Ambulatory Problems    Diagnosis Date Noted  . Hypothyroidism 11/04/2011   Past Medical History:  Diagnosis Date  . Hyperlipidemia   . Hypertension   . PCOS (polycystic ovarian syndrome)   . Thyroid disease      Review of Systems     Objective:   Physical Exam  Constitutional: She is oriented to person, place, and time. She appears well-developed and well-nourished.  Obese.   Cardiovascular: Normal rate, regular rhythm and normal heart sounds.   Pulmonary/Chest:  Effort normal and breath sounds normal.  No CVA tenderness.   Abdominal: Soft. Bowel sounds are normal. She exhibits no distension. There is no tenderness.  Neurological: She is alert and oriented to person, place, and time.  Psychiatric: She has a normal mood and affect. Her behavior is normal.          Assessment & Plan:  Marland KitchenMarland KitchenBriell was seen today for medication refill.  Diagnoses and all orders for this visit:  Morbid obesity ([place])  Dark yellow-colored urine -     POCT Urinalysis Dipstick -     Urine Culture  Hypertension, unspecified type -     CBC with Differential -     COMPLETE METABOLIC PANEL WITH GFR  Hypothyroidism, unspecified type -     TSH  Hyperlipidemia, unspecified hyperlipidemia type -     Lipid Profile  Prediabetes -     HgB A1c  Elevated liver enzymes -     COMPLETE METABOLIC PANEL WITH GFR  Need for influenza vaccination -     Flu Vaccine QUAD 6+ mos PF IM (Fluarix Quad PF)  Abnormal urine color  Hematuria, unspecified type   Colonoscopy encouraged to make appt.   Discussed low carb diet with 1500 calories and 80g of protein.  Exercising at least 150 minutes a week.  [employer]itness Pal could be a Microbiologist.  Encouraged patient to get back on track.  Offered medication. She declined.  We did decide to increase wellbutrin  to 2 tablets to further help with mood and potentially help with weight.  Follow up in 2 months.   .. Results for orders placed or performed in visit on 02/22/17  Urine Culture  Result Value Ref Range   Organism ID, Bacteria      Three or more organisms present,each greater than 10,000 CFU/mL.These organisms,commonly found on external and internal genitalia,are considered to be colonizers.No further testing performed.   TSH  Result Value Ref Range   TSH 1.57 mIU/L  Lipid Profile  Result Value Ref Range   Cholesterol 238 (H) <200 mg/dL   Triglycerides 264 (H) <150 mg/dL   HDL 41 (L) >50 mg/dL   Total CHOL/HDL  Ratio 5.8 (H) <5.0 Ratio   VLDL 53 (H) <30 mg/dL   LDL Cholesterol 144 (H) <100 mg/dL  HgB A1c  Result Value Ref Range   Hgb A1c MFr Bld 6.1 (H) <5.7 %   Mean Plasma Glucose 128 mg/dL  CBC with Differential  Result Value Ref Range   WBC 6.9 3.8 - 10.8 K/uL   RBC 4.81 3.80 - 5.10 MIL/uL   Hemoglobin 14.5 11.7 - 15.5 g/dL   HCT 42.8 35.0 - 45.0 %   MCV 89.0 80.0 - 100.0 fL   MCH 30.1 27.0 - 33.0 pg   MCHC 33.9 32.0 - 36.0 g/dL   RDW 13.9 11.0 - 15.0 %   Platelets 457 (H) 140 - 400 K/uL   MPV 9.3 7.5 - 12.5 fL   Neutro Abs 3,864 1,500 - 7,800 cells/uL   Lymphs Abs 2,346 850 - 3,900 cells/uL   Monocytes Absolute 483 200 - 950 cells/uL   Eosinophils Absolute 138 15 - 500 cells/uL   Basophils Absolute 69 0 - 200 cells/uL   Neutrophils Relative % 56 %   Lymphocytes Relative 34 %   Monocytes Relative 7 %   Eosinophils Relative 2 %   Basophils Relative 1 %   Smear Review Criteria for review not met   COMPLETE METABOLIC PANEL WITH GFR  Result Value Ref Range   Sodium 137 135 - 146 mmol/L   Potassium 3.6 3.5 - 5.3 mmol/L   Chloride 98 98 - 110 mmol/L   CO2 26 20 - 32 mmol/L   Glucose, Bld 110 (H) 65 - 99 mg/dL   BUN 10 7 - 25 mg/dL   Creat 0.66 0.50 - 1.05 mg/dL   Total Bilirubin 0.6 0.2 - 1.2 mg/dL   Alkaline Phosphatase 91 33 - 130 U/L   AST 27 10 - 35 U/L   ALT 27 6 - 29 U/L   Total Protein 7.4 6.1 - 8.1 g/dL   Albumin 4.3 3.6 - 5.1 g/dL   Calcium 9.9 8.6 - 10.4 mg/dL   GFR, Est African American >89 >=60 mL/min   GFR, Est Non African American >89 >=60 mL/min  POCT Urinalysis Dipstick  Result Value Ref Range   Color, UA yellow    Clarity, UA clear    Glucose, UA neg    Bilirubin, UA neg    Ketones, UA neg    Spec Grav, UA 1.025 1.010 - 1.025   Blood, UA trace    pH, UA 6.0 5.0 - 8.0   Protein, UA neg    Urobilinogen, UA 0.2 0.2 or 1.0 E.U./dL   Nitrite, UA neg    Leukocytes, UA Negative Negative     Hematuria. Recheck in 2 weeks if still present will get  mircoscopic and consider referral.

## 2017-02-22 NOTE — Patient Instructions (Signed)
Follow up in 2 weeks for urine recheck.  Follow up in 2 months weight.  Increase wellbutrin to 2 tablets.

## 2017-02-23 LAB — COMPLETE METABOLIC PANEL WITH GFR
ALT: 27 U/L (ref 6–29)
AST: 27 U/L (ref 10–35)
Albumin: 4.3 g/dL (ref 3.6–5.1)
Alkaline Phosphatase: 91 U/L (ref 33–130)
BUN: 10 mg/dL (ref 7–25)
CHLORIDE: 98 mmol/L (ref 98–110)
CO2: 26 mmol/L (ref 20–32)
CREATININE: 0.66 mg/dL (ref 0.50–1.05)
Calcium: 9.9 mg/dL (ref 8.6–10.4)
GFR, Est African American: >89 mL/min (ref >=60)
GFR, Est Non African American: >89 mL/min (ref >=60)
GLUCOSE: 110 mg/dL — AB (ref 65–99)
POTASSIUM: 3.6 mmol/L (ref 3.5–5.3)
SODIUM: 137 mmol/L (ref 135–146)
Total Bilirubin: 0.6 mg/dL (ref 0.2–1.2)
Total Protein: 7.4 g/dL (ref 6.1–8.1)

## 2017-02-23 LAB — URINE CULTURE

## 2017-02-23 LAB — LIPID PANEL
Cholesterol: 238 mg/dL — ABNORMAL HIGH (ref <200)
HDL: 41 mg/dL — ABNORMAL LOW (ref >50)
LDL CALC: 144 mg/dL — AB (ref <100)
Total CHOL/HDL Ratio: 5.8 Ratio — ABNORMAL HIGH (ref <5.0)
Triglycerides: 264 mg/dL — ABNORMAL HIGH (ref <150)
VLDL: 53 mg/dL — AB (ref <30)

## 2017-02-23 LAB — HEMOGLOBIN A1C
HEMOGLOBIN A1C: 6.1 % — AB (ref <5.7)
MEAN PLASMA GLUCOSE: 128 mg/dL

## 2017-02-24 ENCOUNTER — Other Ambulatory Visit: Payer: Self-pay | Admitting: Physician Assistant

## 2017-02-24 ENCOUNTER — Encounter: Payer: Self-pay | Admitting: Physician Assistant

## 2017-02-24 NOTE — Telephone Encounter (Signed)
Need flu shot added to encounter.

## 2017-02-25 ENCOUNTER — Other Ambulatory Visit: Payer: Self-pay | Admitting: Physician Assistant

## 2017-03-08 ENCOUNTER — Ambulatory Visit (INDEPENDENT_AMBULATORY_CARE_PROVIDER_SITE_OTHER): Payer: BLUE CROSS/BLUE SHIELD | Admitting: Physician Assistant

## 2017-03-08 VITALS — BP 126/70 | HR 87 | Ht 64.0 in | Wt 260.0 lb

## 2017-03-08 DIAGNOSIS — R319 Hematuria, unspecified: Secondary | ICD-10-CM

## 2017-03-08 LAB — POCT URINALYSIS DIPSTICK
Bilirubin, UA: NEGATIVE
Glucose, UA: NEGATIVE
Ketones, UA: NEGATIVE
Leukocytes, UA: NEGATIVE
NITRITE UA: NEGATIVE
PH UA: 5 (ref 5.0–8.0)
Protein, UA: NEGATIVE
RBC UA: NEGATIVE
Spec Grav, UA: 1.025 (ref 1.010–1.025)
UROBILINOGEN UA: 0.2 U/dL

## 2017-03-08 MED ORDER — BUPROPION HCL ER (XL) 300 MG PO TB24: 300.0000 mg | ORAL_TABLET | Freq: Every day | ORAL | 1 refills | Status: DC

## 2017-03-08 NOTE — Progress Notes (Signed)
   Subjective:    Patient ID: Kelly Reeves, female    DOB: Apr 03, 1966, 51 y.o.   MRN: [id number]  HPI Pt is here for a follow- up U/A. Pt denies flank pain, pelvic pain, fever, chills, or sweats.   Review of Systems     Objective:   Physical Exam        Assessment & Plan:  Pt stated she is doing well taking Wellbutrin XL 300 mg and wants a refill.  Pt stated she is not having any side effects from it. Please advise?  [name].[name]. Results for orders placed or performed in visit on 03/08/17  POCT urinalysis dipstick  Result Value Ref Range   Color, UA yellow    Clarity, UA slightly cloudy    Glucose, UA negative    Bilirubin, UA negative    Ketones, UA negative    Spec Grav, UA 1.025 1.010 - 1.025   Blood, UA negative    pH, UA 5.0 5.0 - 8.0   Protein, UA negative    Urobilinogen, UA 0.2 0.2 or 1.0 E.U./dL   Nitrite, UA negative    Leukocytes, UA Negative Negative   Ua looks good. Reassurance hematuria resolved.   Refilled wellbutrin at 300mg  sent to pharmacy. Tandy GawJade Breeback PA-c

## 2017-03-08 NOTE — Progress Notes (Signed)
Call pt: no more blood. Urine dipstick is normal.

## 2017-03-25 ENCOUNTER — Other Ambulatory Visit: Payer: Self-pay | Admitting: Physician Assistant

## 2017-04-19 ENCOUNTER — Ambulatory Visit: Payer: BLUE CROSS/BLUE SHIELD | Admitting: Physician Assistant

## 2017-04-26 ENCOUNTER — Ambulatory Visit (INDEPENDENT_AMBULATORY_CARE_PROVIDER_SITE_OTHER): Payer: BLUE CROSS/BLUE SHIELD | Admitting: Physician Assistant

## 2017-04-26 ENCOUNTER — Encounter: Payer: Self-pay | Admitting: Physician Assistant

## 2017-04-26 MED ORDER — PHENTERMINE HCL 37.5 MG PO TABS: 37.5000 mg | ORAL_TABLET | Freq: Every day | ORAL | 0 refills | Status: DC

## 2017-04-26 NOTE — Progress Notes (Signed)
Subjective:    Patient ID: Kelly Reeves, female    DOB: [birth year], 51 y.o.   MRN: [id number]  HPI  Patient is a 51 year old morbidly obese female who presents to the clinic to discuss weight.  She has been trying to lose weight on her own for the past few months.  She was doing pretty well.  She has gained 3 pounds in the last 3 months.  She declines any exercise.  She is trying to watch what she eats but she is not counting her calories or on any formal diet plan.  She has tried phentermine a few times and she always loses weight but she cannot seem to keep the weight off.  She is not a candidate for Saxenda due to family history of thyroid cancers and she is unclear of what type.  She has tried and failed contrave and belviq due to lack of efficacy.  She comes in very frustrated and she knows this is affecting her life and mood.  .. Active Ambulatory Problems    Diagnosis Date Noted  . Hypothyroidism 11/10/2010  . Hyperlipidemia 10/22/2011  . HTN (hypertension) 10/22/2011  . PCOS (polycystic ovarian syndrome) 10/22/2011  . Severe obesity (BMI >= 40) ([facility]) 10/24/2012  . Obesity, Class III, BMI 50-49.9 (morbid obesity) ([facility]) 08/21/2013  . Onychomycosis 08/21/2013  . Obesity 10/16/2014  . Thyroid activity decreased 10/16/2014  . Abnormal weight gain 10/16/2014  . Rash and nonspecific skin eruption 07/15/2015  . Prediabetes 12/18/2015  . Elevated liver enzymes 12/18/2015  . Right ankle pain 12/29/2015  . Gallstones 12/30/2015  . Depression 01/13/2016  . Large breasts 02/17/2016  . Morbid obesity ([facility]) 05/18/2016  . Hematuria 02/22/2017   Resolved Ambulatory Problems    Diagnosis Date Noted  . Hypothyroidism 11/04/2011   Past Medical History:  Diagnosis Date  . Hyperlipidemia   . Hypertension   . PCOS (polycystic ovarian syndrome)   . Thyroid disease      Review of Systems  All other systems reviewed and are negative.      Objective:   Physical Exam   Constitutional: She is oriented to person, place, and time. She appears well-developed and well-nourished.  Obese  HENT:  Head: Normocephalic and atraumatic.  Cardiovascular: Normal rate, regular rhythm and normal heart sounds.   Pulmonary/Chest: Effort normal and breath sounds normal.  Neurological: She is alert and oriented to person, place, and time.  Psychiatric: Her behavior is normal.  Tearful          Assessment & Plan:  Marland KitchenMarland KitchenZanaiya was seen today for obesity.  Diagnoses and all orders for this visit:  Morbid obesity ([facility]) -     phentermine (ADIPEX-P) 37.5 MG tablet; Take 1 tablet (37.5 mg total) by mouth daily before breakfast. -     Amb Referral to Bariatric Surgery   Patient requests to retry phentermine to see if this could jump start her.  We had a long discussion about how I do not think this is a long-term fix for her problem.  She has failed contrave and belviq.  She does not want to try Saxenda due to her family history of thyroid cancers.   [name].Discussed low carb diet with 1500 calories and 80g of protein.  Exercising at least 150 minutes a week.  [employer] could be a Chief Technology Officer.   Patient has been actively trying to lose weight for over a year.  We discussed bariatric referral to consider surgery.  Patient is in  agreement.  Follow up in one month on phentermine.   [name].Spent 30 minutes with patient and greater than 50 percent of visit spent counseling patient regarding treatment plan.

## 2017-04-26 NOTE — Patient Instructions (Signed)
Bariatric Surgery Information Bariatric surgery, also called weight loss surgery, is a procedure that helps you lose weight. You may consider or your health care provider may suggest bariatric surgery if:  You are severely obese and have been unable to lose weight through diet and exercise.  You have health problems related to obesity, such as: ? Type 2 diabetes. ? Heart disease. ? Lung disease.  How does bariatric surgery help me lose weight? Bariatric surgery helps you lose weight by decreasing how much food your body absorbs. This is done by closing off part of your stomach to make it smaller. This restricts the amount of food your stomach can hold. Bariatric surgery can also change your body's regular digestive process, so that food bypasses the parts of your body that absorb calories and nutrients. If you decide to have bariatric surgery, it is important to continue to eat a healthy diet and exercise regularly after the surgery. What are the different kinds of bariatric surgery? There are two kinds of bariatric surgeries:  Restrictive surgeries make your stomach smaller. They do not change your digestive process. The smaller the size of your new stomach, the less food you can eat. There are different types of restrictive surgeries.  Malabsorptive surgeries both make your stomach smaller and alter your digestive process so that your body processes less calories and nutrients. These are the most common kind of bariatric surgery. There are different types of malabsorptive surgeries.  What are the different types of restrictive surgery? Adjustable Gastric Banding In this procedure, an inflatable band is placed around your stomach near the upper end. This makes the passageway for food into the rest of your stomach much smaller. The band can be adjusted, making it tighter or looser, by filling it with salt solution. Your surgeon can adjust the band based on how are you feeling and how much  weight you are losing. The band can be removed in the future. Vertical Banded Gastroplasty In this procedure, staples are used to separate your stomach into two parts, a small upper pouch and a bigger lower pouch. This decreases how much food you can eat. Sleeve Gastrectomy In this procedure, your stomach is made smaller. This is done by surgically removing a large part of your stomach. When your stomach is smaller, you feel full more quickly and reduce how much you eat. What are the different types of malabsorptive surgery? Roux-en-Y Gastric Bypass (RGB) This is the most common weight loss surgery. In this procedure, a small stomach pouch is created in the upper part of your stomach. Next, this small stomach pouch is attached directly to the middle part of your small intestine. The farther down your small intestine the new connection is made, the fewer calories and nutrients you will absorb. Biliopancreatic Diversion with Duodenal Switch (BPD/DS) This is a multi-step procedure. In this procedure, a large part of your stomach is removed, making your stomach smaller. Next, this smaller stomach is attached to the lower part of your small intestine. Like the RGB surgery, you absorb fewer calories and nutrients the farther down your small intestine the attachment is made. What are the risks of bariatric surgery? As with any surgical procedure, each type of bariatric surgery has its own risks. These risks also depend on your age, your overall health, and any other medical conditions you may have. When deciding on bariatric surgery, it is very important to:  Talk to your health care provider and choose the surgery that is best for   you.  Ask your health care provider about specific risks for the surgery you choose.  Where to find more information:  American Society for Metabolic & Bariatric Surgery: www.asmbs.org  Weight-control Information Network (WIN): win.niddk.nih.gov This information is not  intended to replace advice given to you by your health care provider. Make sure you discuss any questions you have with your health care provider. Document Released: 06/20/2005 Document Revised: 11/26/2015 Document Reviewed: 12/19/2012 Elsevier Interactive Patient Education  2017 [employer] Inc.  

## 2017-06-21 ENCOUNTER — Other Ambulatory Visit: Payer: Self-pay | Admitting: Physician Assistant

## 2017-08-23 ENCOUNTER — Other Ambulatory Visit: Payer: Self-pay | Admitting: Physician Assistant

## 2017-08-27 ENCOUNTER — Other Ambulatory Visit: Payer: Self-pay | Admitting: Physician Assistant

## 2017-09-26 ENCOUNTER — Other Ambulatory Visit: Payer: Self-pay | Admitting: *Deleted

## 2017-09-26 MED ORDER — BUPROPION HCL ER (XL) 300 MG PO TB24: 300.0000 mg | ORAL_TABLET | Freq: Every day | ORAL | 0 refills | Status: DC

## 2017-11-15 IMAGING — MR MR ANKLE*R* W/O CM
6 series · 40 of 40 positions shown · non-contrast
Comparison: None.

CLINICAL DATA: Right ankle pain and weakness for 6 months.

EXAM:
MRI OF THE RIGHT ANKLE WITHOUT CONTRAST
TECHNIQUE: Multiplanar, multisequence MR imaging of the ankle was performed. No
intravenous contrast was administered.

[Series 4: PD fat-sat · axial · 3.0mm · 0.62mm/px · z∈[-105,+40]mm · 9 of 45 slices shown]
[im 1/45]
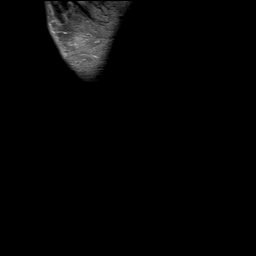
[im 6/45]
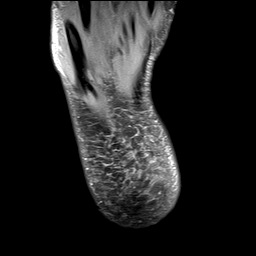
[im 12/45]
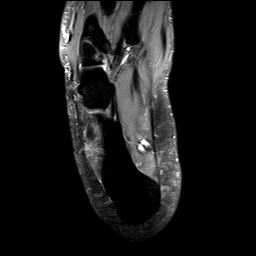
[im 17/45]
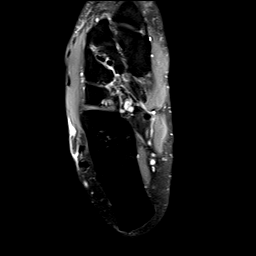
[im 23/45]
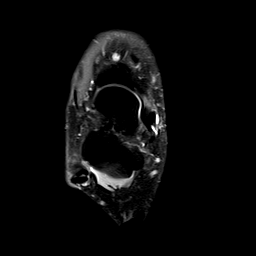
[im 28/45]
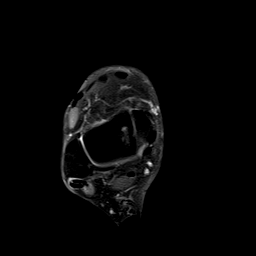
[im 34/45]
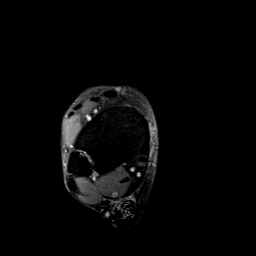
[im 39/45]
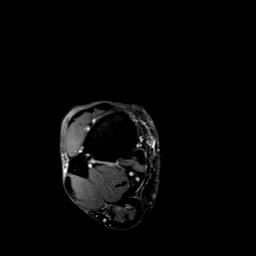
[im 45/45]
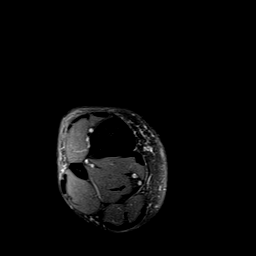

[Series 5: T2 fat-sat · axial · 3.0mm · 0.62mm/px · z∈[-105,+40]mm · 8 of 45 slices shown (1 of 3)]
[im 1/45]
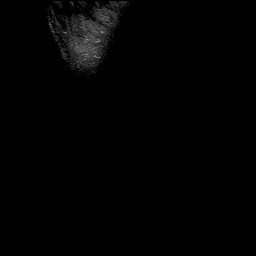
[im 7/45]
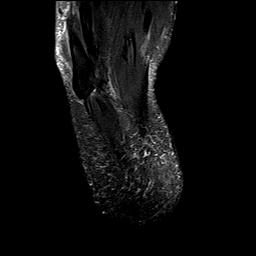
[im 13/45]
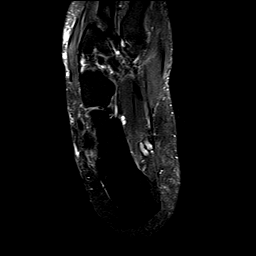
[im 19/45]
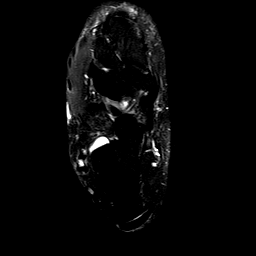
[im 26/45]
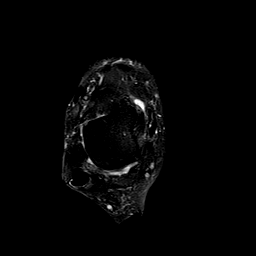
[im 32/45]
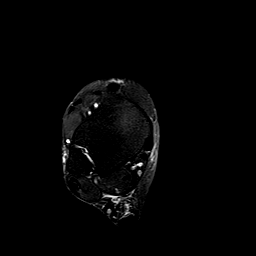
[im 38/45]
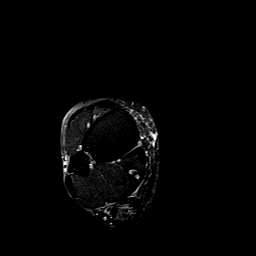
[im 45/45]
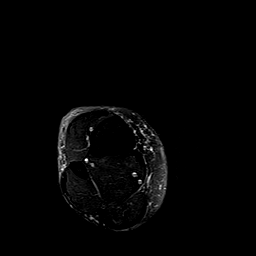

[Series 6: T2 fat-sat · coronal · 3.0mm · 0.70mm/px · 8 of 45 slices shown (2 of 3)]
[im 1/45]
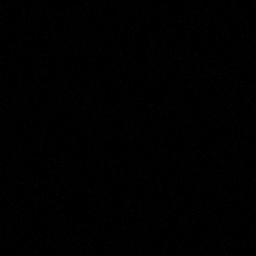
[im 7/45]
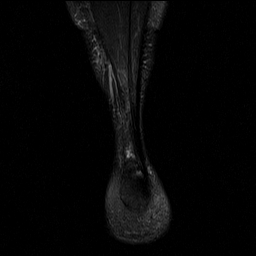
[im 13/45]
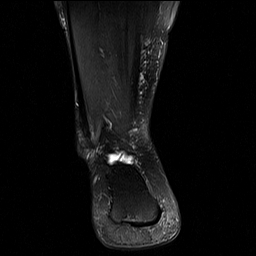
[im 19/45]
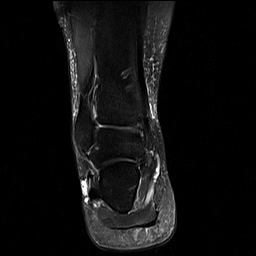
[im 26/45]
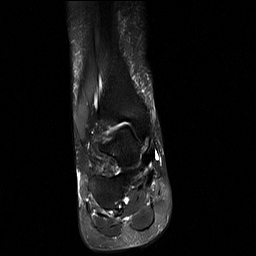
[im 32/45]
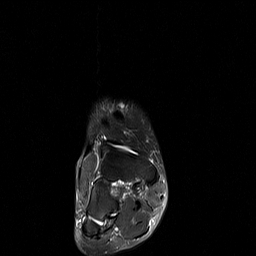
[im 38/45]
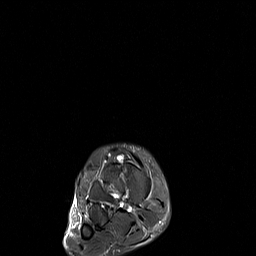
[im 45/45]
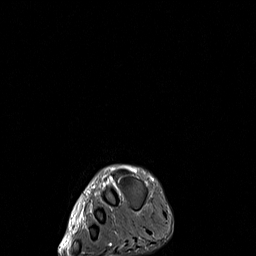

[Series 7: T1 · sagittal · 3.0mm · 0.70mm/px · 5 of 29 slices shown]
[im 1/29]
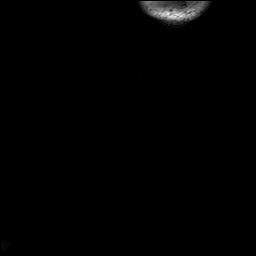
[im 8/29]
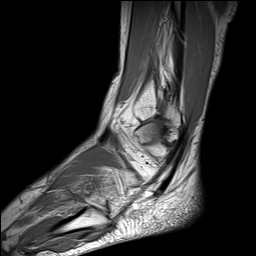
[im 15/29]
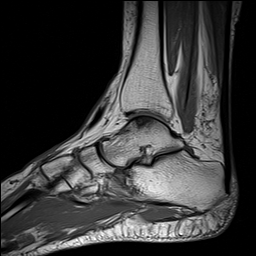
[im 22/29]
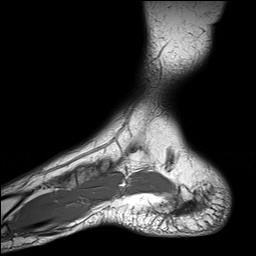
[im 29/29]
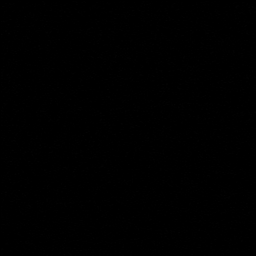

[Series 8: T2 fat-sat · sagittal · 3.0mm · 0.70mm/px · 5 of 29 slices shown (3 of 3)]
[im 1/29]
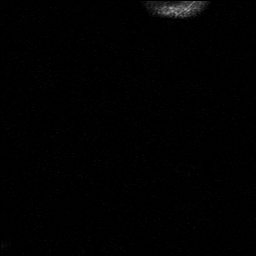
[im 8/29]
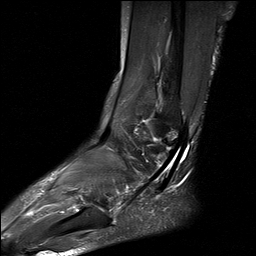
[im 15/29]
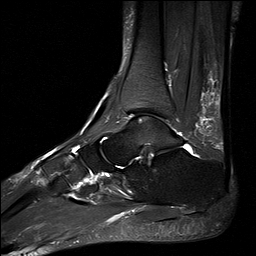
[im 22/29]
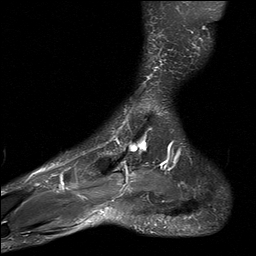
[im 29/29]
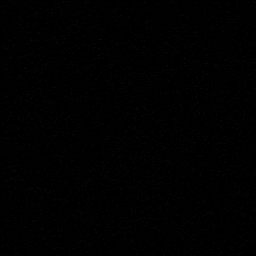

[Series 9: STIR · sagittal · 3.0mm · 0.70mm/px · 5 of 29 slices shown]
[im 1/29]
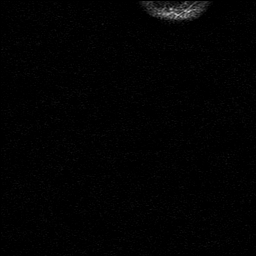
[im 8/29]
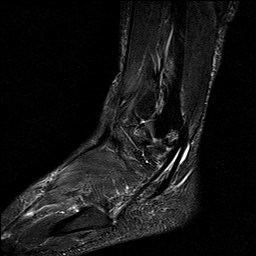
[im 15/29]
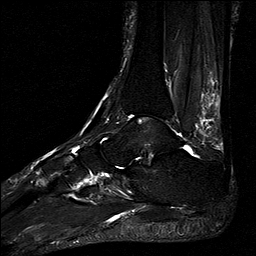
[im 22/29]
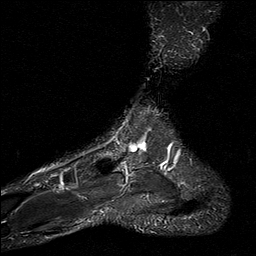
[im 29/29]
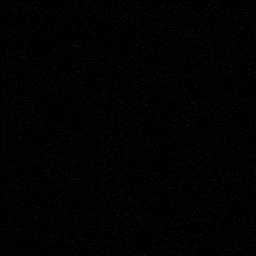

[40 of 40 positions shown; findings below may reference images not displayed]

FINDINGS: TENDONS

Peroneal: Peroneal longus tendon intact. Peroneal brevis intact.
Mild tenosynovitis.

Posteromedial: Posterior tibial tendon intact. Flexor hallucis
longus tendon intact. Flexor digitorum longus tendon intact.

Anterior: Tibialis anterior tendon intact. Extensor hallucis longus
tendon intact Extensor digitorum longus tendon intact.

Achilles:  Intact.

Plantar Fascia: Intact.

LIGAMENTS

Lateral: Anterior talofibular ligament intact. Posterior talofibular
ligament intact. Anterior and posterior tibiofibular ligaments
intact.

Medial: Deltoid ligament intact. Spring ligament intact.

CARTILAGE

Ankle Joint: No joint effusion. Normal ankle mortise. 14 x 5 mm
osteochondral lesion with partial thickness cartilage loss and
subchondral cystic changes with surrounding marrow edema involving
the medial corner of the talar dome.

Subtalar Joints/Sinus Tarsi: Normal subtalar joints. No subtalar
joint effusion. Normal sinus tarsi.

Bones: No fracture or dislocation. Mild osteoarthritis of the
proximal cuboid at the calcaneocuboid articulation. Mild
osteoarthritis of the second TMT joint.

Soft Tissue: No fluid collection or hematoma.
IMPRESSION: 1. 14 x 5 mm osteochondral lesion with partial thickness cartilage
loss and subchondral cystic changes with surrounding marrow edema
involving the medial corner of the talar dome.
2. Mild peroneal tenosynovitis.

## 2017-12-13 ENCOUNTER — Ambulatory Visit (INDEPENDENT_AMBULATORY_CARE_PROVIDER_SITE_OTHER): Payer: BLUE CROSS/BLUE SHIELD | Admitting: Physician Assistant

## 2017-12-13 ENCOUNTER — Encounter: Payer: Self-pay | Admitting: Physician Assistant

## 2017-12-13 VITALS — BP 152/87 | HR 89 | Ht 64.0 in | Wt 276.0 lb

## 2017-12-13 DIAGNOSIS — Z1211 Encounter for screening for malignant neoplasm of colon: Secondary | ICD-10-CM | POA: Diagnosis not present

## 2017-12-13 DIAGNOSIS — E782 Mixed hyperlipidemia: Secondary | ICD-10-CM

## 2017-12-13 DIAGNOSIS — E039 Hypothyroidism, unspecified: Secondary | ICD-10-CM | POA: Diagnosis not present

## 2017-12-13 DIAGNOSIS — R7303 Prediabetes: Secondary | ICD-10-CM

## 2017-12-13 DIAGNOSIS — I10 Essential (primary) hypertension: Secondary | ICD-10-CM

## 2017-12-13 MED ORDER — HYDROCHLOROTHIAZIDE 25 MG PO TABS: 25.0000 mg | ORAL_TABLET | Freq: Every day | ORAL | 4 refills | Status: DC

## 2017-12-13 NOTE — Progress Notes (Signed)
Subjective:    Patient ID: Kelly Reeves, female    DOB: 1966/06/06, 52 y.o.   MRN: [id number]  HPI  Pt is a 52 yo morbidly obese female who presents to the for 3 month follow up. She has been working on weight loss and healthy lifestyle. She has gained weight despite her efforts. She has struggled with weight most of her life. She has actively been trying to lose weight for 3 years. She will lose and then gain. She has tried numerous programs to lose weight.  Weight watches  Phentermine  wellbutrin Belviq.   She has HTN, prediabetes, hyperlipidemia that would likely improved if weight improved. No CP, palpitations, headaches or vision changes.   She was never called about a colonoscopy.she request referral today.  .. Active Ambulatory Problems    Diagnosis Date Noted  . Hypothyroidism 11/10/2010  . Hyperlipidemia 10/22/2011  . HTN (hypertension) 10/22/2011  . PCOS (polycystic ovarian syndrome) 10/22/2011  . Severe obesity (BMI >= 40) (HCC) 10/24/2012  . Obesity, Class III, BMI 40-49.9 (morbid obesity) (HCC) 08/21/2013  . Onychomycosis 08/21/2013  . Obesity 10/16/2014  . Thyroid activity decreased 10/16/2014  . Abnormal weight gain 10/16/2014  . Rash and nonspecific skin eruption 07/15/2015  . Prediabetes 12/18/2015  . Elevated liver enzymes 12/18/2015  . Right ankle pain 12/29/2015  . Gallstones 12/30/2015  . Large breasts 02/17/2016  . Morbid obesity (HCC) 05/18/2016  . Hematuria 02/22/2017   Resolved Ambulatory Problems    Diagnosis Date Noted  . Hypothyroidism 11/04/2011  . Depression 01/13/2016   Past Medical History:  Diagnosis Date  . Hyperlipidemia   . Hypertension   . PCOS (polycystic ovarian syndrome)   . Thyroid disease      Review of Systems  All other systems reviewed and are negative.      Objective:   Physical Exam  Constitutional: She is oriented to person, place, and time. She appears well-developed and well-nourished.  Obese.   HENT:   Head: Normocephalic and atraumatic.  Cardiovascular: Normal rate and regular rhythm.  Pulmonary/Chest: Effort normal and breath sounds normal.  Neurological: She is alert and oriented to person, place, and time.  Psychiatric: She has a normal mood and affect. Her behavior is normal.          Assessment & Plan:  [name].[name].Kelly Reeves was seen today for hypertension and obesity.  Diagnoses and all orders for this visit:  Obesity, Class III, BMI 40-49.9 (morbid obesity) (HCC) -     TSH -     Amb Referral to Bariatric Surgery  Essential hypertension -     hydrochlorothiazide (HYDRODIURIL) 25 MG tablet; Take 1 tablet (25 mg total) by mouth daily. -     COMPLETE METABOLIC PANEL WITH GFR -     Amb Referral to Bariatric Surgery  Acquired hypothyroidism -     TSH -     Amb Referral to Bariatric Surgery  Mixed hyperlipidemia -     Lipid Panel w/reflex Direct LDL -     Amb Referral to Bariatric Surgery  Prediabetes -     Hemoglobin A1c -     Amb Referral to Bariatric Surgery  Colon cancer screening -     Ambulatory referral to Gastroenterology  BP great.  Fasting labs ordered.   She has started back gaining weight despite efforts with medications and diet/exercise. Pt has prediabetes and HTN that would respond well to weight loss.  I do think it is time for bariatric intervention. She  agrees to consult. Will refer.   [name].Spent 30 minutes with patient and greater than 50 percent of visit spent counseling patient regarding treatment plan.

## 2017-12-13 NOTE — Patient Instructions (Addendum)
Will make referral. 

## 2017-12-14 LAB — COMPLETE METABOLIC PANEL WITH GFR
AG Ratio: 1.4 (calc) (ref 1.0–2.5)
ALBUMIN MSPROF: 4.2 g/dL (ref 3.6–5.1)
ALKALINE PHOSPHATASE (APISO): 94 U/L (ref 33–130)
ALT: 35 U/L — AB (ref 6–29)
AST: 40 U/L — ABNORMAL HIGH (ref 10–35)
BILIRUBIN TOTAL: 0.6 mg/dL (ref 0.2–1.2)
BUN: 10 mg/dL (ref 7–25)
CHLORIDE: 101 mmol/L (ref 98–110)
CO2: 31 mmol/L (ref 20–32)
CREATININE: 0.82 mg/dL (ref 0.50–1.05)
Calcium: 9.5 mg/dL (ref 8.6–10.4)
GFR, EST AFRICAN AMERICAN: 96 mL/min/{1.73_m2} (ref >=60)
GFR, Est Non African American: 83 mL/min/{1.73_m2} (ref >=60)
GLUCOSE: 122 mg/dL — AB (ref 65–99)
Globulin: 2.9 g/dL (calc) (ref 1.9–3.7)
Potassium: 4 mmol/L (ref 3.5–5.3)
Sodium: 138 mmol/L (ref 135–146)
TOTAL PROTEIN: 7.1 g/dL (ref 6.1–8.1)

## 2017-12-14 LAB — HEMOGLOBIN A1C
Hgb A1c MFr Bld: 6.2 % of total Hgb — ABNORMAL HIGH (ref <5.7)
Mean Plasma Glucose: 131 (calc)
eAG (mmol/L): 7.3 (calc)

## 2017-12-14 LAB — LIPID PANEL W/REFLEX DIRECT LDL
Cholesterol: 243 mg/dL — ABNORMAL HIGH (ref <200)
HDL: 44 mg/dL — ABNORMAL LOW (ref >50)
LDL Cholesterol (Calc): 141 mg/dL (calc) — ABNORMAL HIGH
NON-HDL CHOLESTEROL (CALC): 199 mg/dL — AB (ref <130)
TRIGLYCERIDES: 376 mg/dL — AB (ref <150)
Total CHOL/HDL Ratio: 5.5 (calc) — ABNORMAL HIGH (ref <5.0)

## 2017-12-14 LAB — TSH: TSH: 4.05 m[IU]/L

## 2017-12-18 NOTE — Progress Notes (Signed)
Call pt: A!C rising a bit at 6.2. Will continue to monitor this.  Thyroid normal range but has come up quite a bit from 9 months ago. We could consider increasing thyroid medication if felt symptoms of thyroid being too low.  Liver enzymes are back up from over a year ago. You are on cholesterol medication that can make go up but I think it could also be your weight. Fat around liver can cause enzymes to go up.  You are on livalo 4g? Your TG elevated. HDL low. LdL not to goal.

## 2017-12-18 NOTE — Progress Notes (Signed)
Pt has seen results on MyChart and message also sent for patient to call back if any questions.

## 2017-12-20 ENCOUNTER — Ambulatory Visit (INDEPENDENT_AMBULATORY_CARE_PROVIDER_SITE_OTHER): Payer: BLUE CROSS/BLUE SHIELD | Admitting: Physician Assistant

## 2017-12-20 ENCOUNTER — Encounter: Payer: Self-pay | Admitting: Physician Assistant

## 2017-12-20 VITALS — BP 144/94 | HR 84 | Wt 273.0 lb

## 2017-12-20 DIAGNOSIS — E039 Hypothyroidism, unspecified: Secondary | ICD-10-CM | POA: Diagnosis not present

## 2017-12-20 DIAGNOSIS — I10 Essential (primary) hypertension: Secondary | ICD-10-CM | POA: Diagnosis not present

## 2017-12-20 DIAGNOSIS — E782 Mixed hyperlipidemia: Secondary | ICD-10-CM | POA: Diagnosis not present

## 2017-12-20 DIAGNOSIS — R739 Hyperglycemia, unspecified: Secondary | ICD-10-CM

## 2017-12-20 DIAGNOSIS — E8881 Metabolic syndrome: Secondary | ICD-10-CM | POA: Diagnosis not present

## 2017-12-20 DIAGNOSIS — R7303 Prediabetes: Secondary | ICD-10-CM

## 2017-12-20 MED ORDER — SEMAGLUTIDE(0.25 OR 0.5MG/DOS) 2 MG/1.5ML ~~LOC~~ SOPN: PEN_INJECTOR | SUBCUTANEOUS | 0 refills | Status: DC

## 2017-12-20 MED ORDER — LEVOTHYROXINE SODIUM 125 MCG PO TABS: 125.0000 ug | ORAL_TABLET | Freq: Every day | ORAL | 0 refills | Status: DC

## 2017-12-20 MED ORDER — PITAVASTATIN CALCIUM 4 MG PO TABS: 1.0000 | ORAL_TABLET | Freq: Every day | ORAL | 4 refills | Status: DC

## 2017-12-20 NOTE — Progress Notes (Signed)
Subjective:    Patient ID: Kelly Reeves, female    DOB: 1965-08-05, 52 y.o.   MRN: [id number]  HPI Pt is a an pleasant obese female who presents to the clinic to follow up on HTN. Her BP readings have been elevated since she has been gaining her weight back. She is on HCTZ. She denies any CP, palpitations, headaches or vision changes. Her cholesterol was not to goal when checked recently on livalo. Referral was made for bariatric surgery solutions but she has not been called. Her A!C increased again on recheck. She is not really exercising right now but has recently but now in a downward spiral.   .. Active Ambulatory Problems    Diagnosis Date Noted  . Hypothyroidism 11/10/2010  . Hyperlipidemia 10/22/2011  . HTN (hypertension) 10/22/2011  . PCOS (polycystic ovarian syndrome) 10/22/2011  . Severe obesity (BMI >= 40) (HCC) 10/24/2012  . Obesity, Class III, BMI 40-49.9 (morbid obesity) (HCC) 08/21/2013  . Onychomycosis 08/21/2013  . Obesity 10/16/2014  . Thyroid activity decreased 10/16/2014  . Abnormal weight gain 10/16/2014  . Rash and nonspecific skin eruption 07/15/2015  . Pre-diabetes 12/18/2015  . Elevated liver enzymes 12/18/2015  . Right ankle pain 12/29/2015  . Gallstones 12/30/2015  . Large breasts 02/17/2016  . Morbid obesity (HCC) 05/18/2016  . Hematuria 02/22/2017   Resolved Ambulatory Problems    Diagnosis Date Noted  . Hypothyroidism 11/04/2011  . Depression 01/13/2016   Past Medical History:  Diagnosis Date  . Hyperlipidemia   . Hypertension   . PCOS (polycystic ovarian syndrome)   . Thyroid disease       Review of Systems  All other systems reviewed and are negative.      Objective:   Physical Exam  Constitutional: She is oriented to person, place, and time. She appears well-developed and well-nourished.  HENT:  Head: Normocephalic and atraumatic.  Neck: No thyromegaly present.  Cardiovascular: Normal rate and regular rhythm.   Pulmonary/Chest: Effort normal and breath sounds normal.  Neurological: She is alert and oriented to person, place, and time.  Psychiatric: She has a normal mood and affect. Her behavior is normal.          Assessment & Plan:  [name].[name].[name] was seen today for hypertension.  Diagnoses and all orders for this visit:  Essential hypertension  Hyperglycemia -     Semaglutide (OZEMPIC) 0.25 or 0.5 MG/DOSE SOPN; Inject 0.25 mg into the skin once a week for 30 days, THEN 0.5 mg once a week.  Pre-diabetes -     Semaglutide (OZEMPIC) 0.25 or 0.5 MG/DOSE SOPN; Inject 0.25 mg into the skin once a week for 30 days, THEN 0.5 mg once a week.  Obesity, Class III, BMI 40-49.9 (morbid obesity) (HCC) -     Semaglutide (OZEMPIC) 0.25 or 0.5 MG/DOSE SOPN; Inject 0.25 mg into the skin once a week for 30 days, THEN 0.5 mg once a week.  Acquired hypothyroidism -     levothyroxine (SYNTHROID, LEVOTHROID) 125 MCG tablet; Take 1 tablet (125 mcg total) by mouth daily.  Mixed hyperlipidemia -     Pitavastatin Calcium (LIVALO) 4 MG TABS; Take 1 tablet (4 mg total) by mouth daily.   BP not to goal. I wanted to add ARB to regimen but patient declined today. "she wants a few more months". She is aware of risk. She is going to start taking at home and keep a log.   TSH was upper limits of normal. Increased levothyroxine to  see if could help her feel a little better. Recheck in 3 months.   livalo refilled. Pt aware not to goal. She declines any more medication.   A!C is increasing and almost to DM. Discussed options. I would like to get ozempic approved. Discussed titration up. I think GLP-1 would help with weight loss and bring down sugars. Her mother did have thyroid cancer but she is unaware of what kind. I explained to her the increase risk of thyroid cancer if was the medullary. She is willing to assume risk.   Long discussion about how she has metabolic syndrome and it is worsening. If she does not do  something now her risk and outcomes could not be good. Weight loss likely could resolve most of these issues. Referral has been placed to consider bariatric surgery please follow through. Continue with lifestyle management as well.   Follow up in 3 months. Pt agreed she would start BP medications if no weight loss and BP still elevated.   [name].Spent 30 minutes with patient and greater than 50 percent of visit spent counseling patient regarding treatment plan.

## 2017-12-20 NOTE — Telephone Encounter (Signed)
Awaiting determination for Ozempic.

## 2017-12-21 ENCOUNTER — Other Ambulatory Visit: Payer: Self-pay | Admitting: Physician Assistant

## 2017-12-21 DIAGNOSIS — R7303 Prediabetes: Secondary | ICD-10-CM

## 2017-12-21 DIAGNOSIS — E8881 Metabolic syndrome: Secondary | ICD-10-CM | POA: Insufficient documentation

## 2017-12-21 DIAGNOSIS — R739 Hyperglycemia, unspecified: Secondary | ICD-10-CM

## 2017-12-23 ENCOUNTER — Other Ambulatory Visit: Payer: Self-pay | Admitting: Physician Assistant

## 2017-12-24 ENCOUNTER — Other Ambulatory Visit: Payer: Self-pay | Admitting: Physician Assistant

## 2017-12-26 DIAGNOSIS — D122 Benign neoplasm of ascending colon: Secondary | ICD-10-CM | POA: Diagnosis not present

## 2017-12-26 DIAGNOSIS — D123 Benign neoplasm of transverse colon: Secondary | ICD-10-CM | POA: Diagnosis not present

## 2017-12-26 DIAGNOSIS — Z1211 Encounter for screening for malignant neoplasm of colon: Secondary | ICD-10-CM | POA: Diagnosis not present

## 2017-12-26 LAB — HM COLONOSCOPY

## 2018-01-01 NOTE — Telephone Encounter (Signed)
I resent because it was pended to re-send. Will they approve another GLP-1?

## 2018-01-02 ENCOUNTER — Other Ambulatory Visit: Payer: Self-pay | Admitting: Physician Assistant

## 2018-01-02 NOTE — Telephone Encounter (Signed)
Ozempic was denied and no other medication is covered.

## 2018-01-09 ENCOUNTER — Encounter: Payer: Self-pay | Admitting: Physician Assistant

## 2018-02-12 ENCOUNTER — Encounter: Payer: Self-pay | Admitting: Physician Assistant

## 2018-02-12 ENCOUNTER — Telehealth: Payer: Self-pay | Admitting: Physician Assistant

## 2018-02-12 ENCOUNTER — Ambulatory Visit (INDEPENDENT_AMBULATORY_CARE_PROVIDER_SITE_OTHER): Payer: BLUE CROSS/BLUE SHIELD | Admitting: Physician Assistant

## 2018-02-12 ENCOUNTER — Telehealth: Payer: Self-pay

## 2018-02-12 VITALS — BP 141/80 | HR 85 | Ht 64.0 in | Wt 271.0 lb

## 2018-02-12 DIAGNOSIS — L739 Follicular disorder, unspecified: Secondary | ICD-10-CM | POA: Diagnosis not present

## 2018-02-12 DIAGNOSIS — R21 Rash and other nonspecific skin eruption: Secondary | ICD-10-CM

## 2018-02-12 MED ORDER — METHYLPREDNISOLONE SODIUM SUCC 125 MG IJ SOLR
125.0000 mg | Freq: Once | INTRAMUSCULAR | Status: AC
  Administered 2018-02-12: 125 mg via INTRAMUSCULAR

## 2018-02-12 MED ORDER — DOXYCYCLINE HYCLATE 100 MG PO TABS: 100.0000 mg | ORAL_TABLET | Freq: Two times a day (BID) | ORAL | 0 refills | Status: DC

## 2018-02-12 NOTE — Telephone Encounter (Signed)
Kelly Reeves called and complains of an allergic reaction rash from her neck down. She went swimming in a pool this past weekend. She reports this is the third time she broke out with a rash from chlorine. She would like a steroid injection. No openings. Please advise.

## 2018-02-12 NOTE — Telephone Encounter (Signed)
Scheduled

## 2018-02-12 NOTE — Patient Instructions (Signed)

## 2018-02-12 NOTE — Telephone Encounter (Signed)
Ok to double book when she is available today.

## 2018-02-12 NOTE — Telephone Encounter (Signed)
Please call patient and let her know insurance will not cover any medication in this class until a1c above 6.5.

## 2018-02-12 NOTE — Progress Notes (Signed)
   Subjective:    Patient ID: Kelly Reeves, female    DOB: [birth year], 52 y.o.   MRN: [id number]  HPI Patient is a 52 year old female who presents to the clinic with her husband with a itchy irritating rash of her back, torso, bilateral legs.  She describes the rash as red, itchy, with pustules.  Rash started a week and a half ago when she was at the beach.  She noticed that after being in the pool all day and wearing her bathing suit.  She has had a very similar rash to other times in her life and both were related to a chlorinated pool.  She is concerned that she is having allergic reaction to the pool.  She is very itchy.  She has been taking Benadryl 2-3 times a day with some relief.  She denies any fever, chills, body aches.  No one else in the household to swim in the pool has any similar reaction.  .. Active Ambulatory Problems    Diagnosis Date Noted  . Hypothyroidism 11/10/2010  . Hyperlipidemia 10/22/2011  . HTN (hypertension) 10/22/2011  . PCOS (polycystic ovarian syndrome) 10/22/2011  . Severe obesity (BMI >= 40) (HCC) 10/24/2012  . Obesity, Class III, BMI 40-49.9 (morbid obesity) (HCC) 08/21/2013  . Onychomycosis 08/21/2013  . Obesity 10/16/2014  . Thyroid activity decreased 10/16/2014  . Abnormal weight gain 10/16/2014  . Rash and nonspecific skin eruption 07/15/2015  . Pre-diabetes 12/18/2015  . Elevated liver enzymes 12/18/2015  . Right ankle pain 12/29/2015  . Gallstones 12/30/2015  . Large breasts 02/17/2016  . Morbid obesity (HCC) 05/18/2016  . Hematuria 02/22/2017  . Metabolic syndrome 12/21/2017   Resolved Ambulatory Problems    Diagnosis Date Noted  . Hypothyroidism 11/04/2011  . Depression 01/13/2016   Past Medical History:  Diagnosis Date  . Hypertension   . Thyroid disease       Review of Systems  All other systems reviewed and are negative.      Objective:   Physical Exam  Constitutional: She is oriented to person, place, and time. She  appears well-developed and well-nourished.  HENT:  Head: Normocephalic and atraumatic.  Cardiovascular: Normal rate and regular rhythm.  Pulmonary/Chest: Effort normal and breath sounds normal.  Neurological: She is alert and oriented to person, place, and time.  Skin:  Pustule rash on a erythematous base on back and chest. Worst on back.   Psychiatric: She has a normal mood and affect. Her behavior is normal.          Assessment & Plan:  [name].[name].Diagnoses and all orders for this visit:  Folliculitis -     methylPREDNISolone sodium succinate (SOLU-MEDROL) 125 mg/2 mL injection 125 mg -     doxycycline (VIBRA-TABS) 100 MG tablet; Take 1 tablet (100 mg total) by mouth 2 (two) times daily. For 10 days.  Rash -     methylPREDNISolone sodium succinate (SOLU-MEDROL) 125 mg/2 mL injection 125 mg   Hx of this rash after swimming in chlorine pool. Suspect some type of chlorine reaction and some infection of hair follicules. Many pustules of upper back. Treated with shot of solumedrol and doxycycline. HO given. Continue benadryl as needed. Discussed washing off with soap after swimming in the pool and pre-loading with benadryl. Follow up if not improving.

## 2018-03-14 ENCOUNTER — Other Ambulatory Visit: Payer: Self-pay | Admitting: Physician Assistant

## 2018-03-14 DIAGNOSIS — E039 Hypothyroidism, unspecified: Secondary | ICD-10-CM

## 2018-03-21 ENCOUNTER — Encounter: Payer: Self-pay | Admitting: Physician Assistant

## 2018-03-21 ENCOUNTER — Ambulatory Visit (INDEPENDENT_AMBULATORY_CARE_PROVIDER_SITE_OTHER): Payer: BLUE CROSS/BLUE SHIELD | Admitting: Physician Assistant

## 2018-03-21 VITALS — BP 129/72 | HR 91 | Ht 64.0 in | Wt 269.0 lb

## 2018-03-21 DIAGNOSIS — L299 Pruritus, unspecified: Secondary | ICD-10-CM

## 2018-03-21 DIAGNOSIS — F419 Anxiety disorder, unspecified: Secondary | ICD-10-CM | POA: Diagnosis not present

## 2018-03-21 MED ORDER — HYDROXYZINE HCL 10 MG PO TABS: 10.0000 mg | ORAL_TABLET | Freq: Three times a day (TID) | ORAL | 0 refills | Status: DC | PRN

## 2018-03-21 MED ORDER — BUPROPION HCL ER (XL) 150 MG PO TB24: 150.0000 mg | ORAL_TABLET | ORAL | 1 refills | Status: DC

## 2018-03-21 NOTE — Patient Instructions (Addendum)
Pruritus Pruritus is an itching feeling. There are many different conditions and factors that can make your skin itchy. Dry skin is one of the most common causes of itching. Most cases of itching do not require medical attention. Itchy skin can turn into a rash. Follow these instructions at home: Watch your pruritus for any changes. Take these steps to help with your condition: Skin Care  Moisturize your skin as needed. A moisturizer that contains petroleum jelly is best for keeping moisture in your skin.  Take or apply medicines only as directed by your health care provider. This may include: ? Corticosteroid cream. ? Anti-itch lotions. ? Oral anti-histamines.  Apply cool compresses to the affected areas.  Try taking a bath with: ? Epsom salts. Follow the instructions on the packaging. You can get these at your local pharmacy or grocery store. ? Baking soda. Pour a small amount into the bath as directed by your health care provider. ? Colloidal oatmeal. Follow the instructions on the packaging. You can get this at your local pharmacy or grocery store.  Try applying baking soda paste to your skin. Stir water into baking soda until it reaches a paste-like consistency.  Do not scratch your skin.  Avoid hot showers or baths, which can make itching worse. A cold shower may help with itching as long as you use a moisturizer after.  Avoid scented soaps, detergents, and perfumes. Use gentle soaps, detergents, perfumes, and other cosmetic products. General instructions  Avoid wearing tight clothes.  Keep a journal to help track what causes your itch. Write down: ? What you eat. ? What cosmetic products you use. ? What you drink. ? What you wear. This includes jewelry.  Use a humidifier. This keeps the air moist, which helps to prevent dry skin. Contact a health care provider if:  The itching does not go away after several days.  You sweat at night.  You have weight loss.  You  are unusually thirsty.  You urinate more than normal.  You are more tired than normal.  You have abdominal pain.  Your skin tingles.  You feel weak.  Your skin or the whites of your eyes look yellow (jaundice).  Your skin feels numb. This information is not intended to replace advice given to you by your health care provider. Make sure you discuss any questions you have with your health care provider. Document Released: 03/02/2011 Document Revised: 11/26/2015 Document Reviewed: 06/16/2014 Elsevier Interactive Patient Education  2018 [employer]   Dr. Odis LusterBowers.

## 2018-03-21 NOTE — Progress Notes (Signed)
Subjective:    Patient ID: Kelly Reeves, female    DOB: Dec 18, 1965, 52 y.o.   MRN: [id number]  HPI  Pt is a very pleasant 52 yo morbidly obese female who presents to the clinic to follow up on weight.   She has not been very motivated lately. She just recently started back exercising and watching what she eats. She stopped wellbutrin for weight but feels like it could have been helping with her anxiety as well.   She has been having "episodes" of itching at night that are intense and for no reason. This has been going on for a month or so but not every night. She has not had one in a week. She does not notice that she is anxious. She has no trigger. No bug bites. No new lotions, no rash. No fever.   .. Active Ambulatory Problems    Diagnosis Date Noted  . Hypothyroidism 11/10/2010  . Hyperlipidemia 10/22/2011  . HTN (hypertension) 10/22/2011  . PCOS (polycystic ovarian syndrome) 10/22/2011  . Severe obesity (BMI >= 40) (HCC) 10/24/2012  . Obesity, Class III, BMI 40-49.9 (morbid obesity) (HCC) 08/21/2013  . Onychomycosis 08/21/2013  . Obesity 10/16/2014  . Thyroid activity decreased 10/16/2014  . Abnormal weight gain 10/16/2014  . Rash and nonspecific skin eruption 07/15/2015  . Pre-diabetes 12/18/2015  . Elevated liver enzymes 12/18/2015  . Right ankle pain 12/29/2015  . Gallstones 12/30/2015  . Large breasts 02/17/2016  . Morbid obesity (HCC) 05/18/2016  . Hematuria 02/22/2017  . Metabolic syndrome 12/21/2017   Resolved Ambulatory Problems    Diagnosis Date Noted  . Hypothyroidism 11/04/2011  . Depression 01/13/2016   Past Medical History:  Diagnosis Date  . Hypertension   . Thyroid disease      Review of Systems  All other systems reviewed and are negative.      Objective:   Physical Exam  Constitutional: She is oriented to person, place, and time. She appears well-developed and well-nourished.  Obese.   Cardiovascular: Normal rate and regular rhythm.   Pulmonary/Chest: Effort normal and breath sounds normal.  Neurological: She is alert and oriented to person, place, and time.  Skin: No rash noted.  Psychiatric: She has a normal mood and affect. Her behavior is normal.          Assessment & Plan:  Marland KitchenMarland KitchenDiagnoses and all orders for this visit:  Itching -     Hepatic function panel -     TSH -     CBC with Differential/Platelet -     B12 and Folate Panel -     hydrOXYzine (ATARAX/VISTARIL) 10 MG tablet; Take 1 tablet (10 mg total) by mouth 3 (three) times daily as needed for itching.  Anxiety -     buPROPion (WELLBUTRIN XL) 150 MG 24 hr tablet; Take 1 tablet (150 mg total) by mouth every morning. -     hydrOXYzine (ATARAX/VISTARIL) 10 MG tablet; Take 1 tablet (10 mg total) by mouth 3 (three) times daily as needed for itching.   Unclear etiology of itchy. Does not appear to be coming from any contact source. Will check labs to look for any kidney or liver issues. Could be anxiety. She is off wellbutrin. Will restart. Given vistaril to use as needed for itching. Sedation warning discussed. Discussed how to decrease anxiety. Discussed importance of exercise.   Discussed gastric surgery again. Pt is still not on board. She may consider going back to breast surgeon and consider reduction again.  Follow up in 3 months.

## 2018-03-22 LAB — CBC WITH DIFFERENTIAL/PLATELET
BASOS PCT: 1 %
Basophils Absolute: 79 cells/uL (ref 0–200)
EOS ABS: 142 {cells}/uL (ref 15–500)
Eosinophils Relative: 1.8 %
HCT: 41.9 % (ref 35.0–45.0)
HEMOGLOBIN: 14.3 g/dL (ref 11.7–15.5)
Lymphs Abs: 2520 cells/uL (ref 850–3900)
MCH: 30.6 pg (ref 27.0–33.0)
MCHC: 34.1 g/dL (ref 32.0–36.0)
MCV: 89.7 fL (ref 80.0–100.0)
MONOS PCT: 6.8 %
MPV: 9.9 fL (ref 7.5–12.5)
Neutro Abs: 4622 cells/uL (ref 1500–7800)
Neutrophils Relative %: 58.5 %
PLATELETS: 478 10*3/uL — AB (ref 140–400)
RBC: 4.67 10*6/uL (ref 3.80–5.10)
RDW: 13.6 % (ref 11.0–15.0)
Total Lymphocyte: 31.9 %
WBC mixed population: 537 cells/uL (ref 200–950)
WBC: 7.9 10*3/uL (ref 3.8–10.8)

## 2018-03-22 LAB — HEPATIC FUNCTION PANEL
AG Ratio: 1.4 (calc) (ref 1.0–2.5)
ALKALINE PHOSPHATASE (APISO): 92 U/L (ref 33–130)
ALT: 42 U/L — AB (ref 6–29)
AST: 59 U/L — AB (ref 10–35)
Albumin: 4.4 g/dL (ref 3.6–5.1)
BILIRUBIN INDIRECT: 0.7 mg/dL (ref 0.2–1.2)
BILIRUBIN TOTAL: 0.8 mg/dL (ref 0.2–1.2)
Bilirubin, Direct: 0.1 mg/dL (ref 0.0–0.2)
Globulin: 3.2 g/dL (calc) (ref 1.9–3.7)
TOTAL PROTEIN: 7.6 g/dL (ref 6.1–8.1)

## 2018-03-22 LAB — TSH: TSH: 1.06 mIU/L

## 2018-03-22 LAB — B12 AND FOLATE PANEL
Folate: >24 ng/mL
VITAMIN B 12: 295 pg/mL (ref 200–1100)

## 2018-03-22 NOTE — Progress Notes (Signed)
Call pt: b12 is low. Not likely to be causing the itching but you could come in for b12 injection and then start oral b12.   TSH perfect. Do you need refills?   Liver enzymes up some but not overtly concerning.   Not anemic.

## 2018-03-23 ENCOUNTER — Encounter: Payer: Self-pay | Admitting: Physician Assistant

## 2018-03-30 ENCOUNTER — Ambulatory Visit: Payer: BLUE CROSS/BLUE SHIELD

## 2018-04-04 DIAGNOSIS — Z6841 Body Mass Index (BMI) 40.0 and over, adult: Secondary | ICD-10-CM | POA: Diagnosis not present

## 2018-04-04 DIAGNOSIS — Z1231 Encounter for screening mammogram for malignant neoplasm of breast: Secondary | ICD-10-CM | POA: Diagnosis not present

## 2018-04-04 DIAGNOSIS — Z01419 Encounter for gynecological examination (general) (routine) without abnormal findings: Secondary | ICD-10-CM | POA: Diagnosis not present

## 2018-06-18 NOTE — Telephone Encounter (Signed)
Patient was notified at last office visit.

## 2018-06-21 ENCOUNTER — Other Ambulatory Visit: Payer: Self-pay | Admitting: Physician Assistant

## 2018-06-21 DIAGNOSIS — E039 Hypothyroidism, unspecified: Secondary | ICD-10-CM

## 2018-09-05 ENCOUNTER — Other Ambulatory Visit: Payer: Self-pay | Admitting: Physician Assistant

## 2018-09-05 DIAGNOSIS — F419 Anxiety disorder, unspecified: Secondary | ICD-10-CM

## 2018-09-19 ENCOUNTER — Other Ambulatory Visit: Payer: Self-pay

## 2018-09-19 ENCOUNTER — Ambulatory Visit (INDEPENDENT_AMBULATORY_CARE_PROVIDER_SITE_OTHER): Payer: BLUE CROSS/BLUE SHIELD | Admitting: Physician Assistant

## 2018-09-19 ENCOUNTER — Encounter: Payer: Self-pay | Admitting: Physician Assistant

## 2018-09-19 VITALS — BP 132/94 | HR 88 | Temp 98.9°F | Resp 16 | Ht 64.5 in | Wt 251.0 lb

## 2018-09-19 DIAGNOSIS — F419 Anxiety disorder, unspecified: Secondary | ICD-10-CM | POA: Diagnosis not present

## 2018-09-19 DIAGNOSIS — E538 Deficiency of other specified B group vitamins: Secondary | ICD-10-CM

## 2018-09-19 DIAGNOSIS — E039 Hypothyroidism, unspecified: Secondary | ICD-10-CM

## 2018-09-19 MED ORDER — LEVOTHYROXINE SODIUM 125 MCG PO TABS: 125.0000 ug | ORAL_TABLET | Freq: Every day | ORAL | 3 refills | Status: DC

## 2018-09-19 MED ORDER — CYANOCOBALAMIN 1000 MCG/ML IJ SOLN
1000.0000 ug | Freq: Once | INTRAMUSCULAR | Status: AC
  Administered 2018-09-19: 1000 ug via INTRAMUSCULAR

## 2018-09-19 MED ORDER — BUPROPION HCL ER (XL) 150 MG PO TB24: 150.0000 mg | ORAL_TABLET | Freq: Every day | ORAL | 3 refills | Status: DC

## 2018-09-19 NOTE — Progress Notes (Signed)
Subjective:    Patient ID: Kelly Reeves, female    DOB: 29-Aug-1965, 53 y.o.   MRN: [id number]  HPI  Pt is a 53 yo obese female who presents to the clinic for her 6 month follow up.   She has been making diet and exercise changes and down from 275 to 251. She is feeling pretty good. She still struggles with energy. She had low b12 and never came in for injection or started b12.   Her mood is doing well. She notices that her anxiety is better with wellbutrin. She is handling worse stress better as well.   She is hypothyroid. Needs refills.   .. Active Ambulatory Problems    Diagnosis Date Noted  . Hypothyroidism 11/10/2010  . Hyperlipidemia 10/22/2011  . HTN (hypertension) 10/22/2011  . PCOS (polycystic ovarian syndrome) 10/22/2011  . Severe obesity (BMI >= 40) (HCC) 10/24/2012  . Obesity, Class III, BMI 40-49.9 (morbid obesity) (HCC) 08/21/2013  . Onychomycosis 08/21/2013  . Obesity 10/16/2014  . Thyroid activity decreased 10/16/2014  . Abnormal weight gain 10/16/2014  . Rash and nonspecific skin eruption 07/15/2015  . Pre-diabetes 12/18/2015  . Elevated liver enzymes 12/18/2015  . Right ankle pain 12/29/2015  . Gallstones 12/30/2015  . Large breasts 02/17/2016  . Morbid obesity (HCC) 05/18/2016  . Hematuria 02/22/2017  . Metabolic syndrome 12/21/2017   Resolved Ambulatory Problems    Diagnosis Date Noted  . Hypothyroidism 11/04/2011  . Depression 01/13/2016   Past Medical History:  Diagnosis Date  . Hypertension   . Thyroid disease         Review of Systems  All other systems reviewed and are negative.      Objective:   Physical Exam Vitals signs reviewed.  Constitutional:      Appearance: Normal appearance. She is obese.  HENT:     Head: Normocephalic.     Mouth/Throat:     Pharynx: Oropharynx is clear.  Neck:     Comments: No thyroid enlargement.  Cardiovascular:     Rate and Rhythm: Normal rate and regular rhythm.     Pulses: Normal pulses.   Pulmonary:     Effort: Pulmonary effort is normal.     Breath sounds: Normal breath sounds.  Neurological:     General: No focal deficit present.     Mental Status: She is alert and oriented to person, place, and time.  Psychiatric:        Mood and Affect: Mood normal.           Assessment & Plan:  Marland KitchenMarland KitchenMeya was seen today for weight check.  Diagnoses and all orders for this visit:  Low serum vitamin B12 -     B12 and Folate Panel -     cyanocobalamin ((VITAMIN B-12)) injection 1,000 mcg  Anxiety -     buPROPion (WELLBUTRIN XL) 150 MG 24 hr tablet; Take 1 tablet (150 mg total) by mouth daily.  Acquired hypothyroidism -     TSH -     levothyroxine (SYNTHROID, LEVOTHROID) 125 MCG tablet; Take 1 tablet (125 mcg total) by mouth daily. -     B12 and Folate Panel  .[name] Depression screen [facility] 2/9 09/19/2018 12/13/2017 02/22/2017  Decreased Interest 0 0 0  Down, Depressed, Hopeless 0 0 0  PHQ - 2 Score 0 0 0  Altered sleeping 0 - -  Tired, decreased energy 0 - -  Change in appetite 0 - -  Feeling bad or failure about yourself  0 - -  Trouble concentrating 0 - -  Moving slowly or fidgety/restless 0 - -  Suicidal thoughts 0 - -  PHQ-9 Score 0 - -  Difficult doing work/chores Not difficult at all - -    GAD 7 : Generalized Anxiety Score 09/19/2018  Nervous, Anxious, on Edge 0  Control/stop worrying 0  Worry too much - different things 0  Trouble relaxing 0  Restless 0  Easily annoyed or irritable 0  Afraid - awful might happen 0  Total GAD 7 Score 0  Anxiety Difficulty Not difficult at all     Weight loss is going great.  b12 shot given today. Start oral b12 at home. Recheck labs in 2-3 months.  Refilled wellbutrin and levothyroxine. Recheck thyroid in 2 months.  Consider increasing wellbutrin to see if helps more with appetite control. If like the double let me know and will send over 300mg . Keep up the great work.   Follow up in 6 months.   Pt alerted her pap and  [facility]amm[facility] at [facility].

## 2018-09-20 LAB — B12 AND FOLATE PANEL: Folate: >24 ng/mL

## 2018-09-20 LAB — TSH: TSH: 0.89 m[IU]/L

## 2018-09-21 ENCOUNTER — Telehealth: Payer: Self-pay | Admitting: Physician Assistant

## 2018-09-21 ENCOUNTER — Encounter: Payer: Self-pay | Admitting: Physician Assistant

## 2018-09-21 NOTE — Telephone Encounter (Signed)
Can you please look into this?

## 2018-09-21 NOTE — Progress Notes (Signed)
Thyroid looks great. B12 high but likely due to just getting the shot.

## 2018-09-21 NOTE — Telephone Encounter (Signed)
Done

## 2018-09-21 NOTE — Telephone Encounter (Signed)
Fax to get pap and mammo records from [facility]

## 2019-03-07 ENCOUNTER — Other Ambulatory Visit: Payer: Self-pay | Admitting: Physician Assistant

## 2019-03-07 DIAGNOSIS — I10 Essential (primary) hypertension: Secondary | ICD-10-CM

## 2019-03-12 ENCOUNTER — Other Ambulatory Visit: Payer: Self-pay | Admitting: Obstetrics and Gynecology

## 2019-03-12 DIAGNOSIS — Z1231 Encounter for screening mammogram for malignant neoplasm of breast: Secondary | ICD-10-CM

## 2019-03-14 ENCOUNTER — Other Ambulatory Visit: Payer: Self-pay | Admitting: Physician Assistant

## 2019-03-14 DIAGNOSIS — E782 Mixed hyperlipidemia: Secondary | ICD-10-CM

## 2019-03-20 ENCOUNTER — Ambulatory Visit: Payer: BLUE CROSS/BLUE SHIELD | Admitting: Physician Assistant

## 2019-04-10 ENCOUNTER — Encounter: Payer: Self-pay | Admitting: Physician Assistant

## 2019-04-10 LAB — HM DIABETES EYE EXAM

## 2019-04-23 ENCOUNTER — Other Ambulatory Visit: Payer: Self-pay

## 2019-04-23 ENCOUNTER — Ambulatory Visit (INDEPENDENT_AMBULATORY_CARE_PROVIDER_SITE_OTHER): Payer: BC Managed Care – PPO | Admitting: Physician Assistant

## 2019-04-23 ENCOUNTER — Encounter: Payer: Self-pay | Admitting: Physician Assistant

## 2019-04-23 VITALS — BP 132/80 | HR 89 | Ht 64.0 in | Wt 262.0 lb

## 2019-04-23 DIAGNOSIS — Z131 Encounter for screening for diabetes mellitus: Secondary | ICD-10-CM

## 2019-04-23 DIAGNOSIS — Z23 Encounter for immunization: Secondary | ICD-10-CM | POA: Diagnosis not present

## 2019-04-23 DIAGNOSIS — R7303 Prediabetes: Secondary | ICD-10-CM | POA: Diagnosis not present

## 2019-04-23 DIAGNOSIS — E538 Deficiency of other specified B group vitamins: Secondary | ICD-10-CM

## 2019-04-23 DIAGNOSIS — I10 Essential (primary) hypertension: Secondary | ICD-10-CM

## 2019-04-23 DIAGNOSIS — E785 Hyperlipidemia, unspecified: Secondary | ICD-10-CM

## 2019-04-23 LAB — POCT GLYCOSYLATED HEMOGLOBIN (HGB A1C): Hemoglobin A1C: 6.3 % — AB (ref 4.0–5.6)

## 2019-04-23 MED ORDER — HYDROCHLOROTHIAZIDE 25 MG PO TABS: 25.0000 mg | ORAL_TABLET | Freq: Every day | ORAL | 4 refills | Status: DC

## 2019-04-23 MED ORDER — METFORMIN HCL 500 MG PO TABS: 500.0000 mg | ORAL_TABLET | Freq: Two times a day (BID) | ORAL | 1 refills | Status: DC

## 2019-04-23 MED ORDER — CYANOCOBALAMIN 1000 MCG/ML IJ SOLN
1000.0000 ug | Freq: Once | INTRAMUSCULAR | Status: AC
  Administered 2019-04-23: 1000 ug via INTRAMUSCULAR

## 2019-04-23 NOTE — Progress Notes (Signed)
Subjective:    Patient ID: CASH MEADOW, female    DOB: [birth year], 53 y.o.   MRN: [id number]  HPI  Pt is a 53 yo female with pre-diabetes, HTN, hypothyroidism, PCOS, b12 deficiency who presents to the clinic for follow up.   Pt is doing ok. She is taking her medication daily. She admits she has fallen off the diet and exercise train. She is not doing well with that. She never got another b12 shot which did help to give her energy.   Pt denies any CP, palpitations, headaches or vision changes.   .. Active Ambulatory Problems    Diagnosis Date Noted  . Hypothyroidism 11/10/2010  . Hyperlipidemia 10/22/2011  . Essential hypertension 10/22/2011  . PCOS (polycystic ovarian syndrome) 10/22/2011  . Severe obesity (BMI >= 40) (HCC) 10/24/2012  . Obesity, Class III, BMI 40-49.9 (morbid obesity) (HCC) 08/21/2013  . Onychomycosis 08/21/2013  . Obesity 10/16/2014  . Thyroid activity decreased 10/16/2014  . Abnormal weight gain 10/16/2014  . Rash and nonspecific skin eruption 07/15/2015  . Pre-diabetes 12/18/2015  . Elevated liver enzymes 12/18/2015  . Right ankle pain 12/29/2015  . Gallstones 12/30/2015  . Large breasts 02/17/2016  . Morbid obesity (HCC) 05/18/2016  . Hematuria 02/22/2017  . Metabolic syndrome 12/21/2017  . B12 deficiency 04/23/2019  . Dyslipidemia 04/29/2019   Resolved Ambulatory Problems    Diagnosis Date Noted  . Hypothyroidism 11/04/2011  . Depression 01/13/2016   Past Medical History:  Diagnosis Date  . Hypertension   . Thyroid disease      Review of Systems  All other systems reviewed and are negative.      Objective:   Physical Exam Vitals signs reviewed.  Constitutional:      Appearance: Normal appearance. She is obese.  Cardiovascular:     Rate and Rhythm: Normal rate and regular rhythm.     Pulses: Normal pulses.  Pulmonary:     Effort: Pulmonary effort is normal.     Breath sounds: Normal breath sounds.  Neurological:     General:  No focal deficit present.     Mental Status: She is alert and oriented to person, place, and time.  Psychiatric:        Mood and Affect: Mood normal.        Behavior: Behavior normal.      .. Results for orders placed or performed in visit on 04/23/19  POCT glycosylated hemoglobin (Hb A1C)  Result Value Ref Range   Hemoglobin A1C 6.3 (A) 4.0 - 5.6 %   HbA1c POC (<> result, manual entry)     HbA1c, POC (prediabetic range)     HbA1c, POC (controlled diabetic range)          Assessment & Plan:  Marland KitchenMarland KitchenPhylis was seen today for follow-up.  Diagnoses and all orders for this visit:  Pre-diabetes -     Cancel: Hemoglobin A1c -     POCT glycosylated hemoglobin (Hb A1C) -     metFORMIN (GLUCOPHAGE) 500 MG tablet; Take 1 tablet (500 mg total) by mouth 2 (two) times daily with a meal.  Flu vaccine need -     Flu Vaccine QUAD 36+ mos IM  Essential hypertension -     hydrochlorothiazide (HYDRODIURIL) 25 MG tablet; Take 1 tablet (25 mg total) by mouth daily.  B12 deficiency -     B12 and Folate Panel -     cyanocobalamin ((VITAMIN B-12)) injection 1,000 mcg  Screening for diabetes mellitus -  COMPLETE METABOLIC PANEL WITH GFR  Dyslipidemia -     Lipid Panel w/reflex Direct LDL  Severe obesity (BMI >= 40) (HCC)  a1c 6.3 not diabetic but close.  Discussed diabetic diet and exercise.  BP 2nd recheck much better.  Flu shot given today.   Refilled medication.  B12 shot given today. Follow up in 6 weeks for next one.  Recheck labs at that time. Pt handed copy of labs.   Follow up in 3 months.

## 2019-04-23 NOTE — Patient Instructions (Addendum)
Vitamin d 1000 units a day. 4 servings of dairy.  b12 shot every 6-8 weeks.

## 2019-04-24 ENCOUNTER — Ambulatory Visit
Admission: RE | Admit: 2019-04-24 | Discharge: 2019-04-24 | Disposition: A | Discharge status: Home / Self Care | Payer: BC Managed Care – PPO | Source: Ambulatory Visit | Attending: Obstetrics and Gynecology | Admitting: Obstetrics and Gynecology

## 2019-04-24 DIAGNOSIS — Z1231 Encounter for screening mammogram for malignant neoplasm of breast: Secondary | ICD-10-CM

## 2019-04-29 ENCOUNTER — Encounter: Payer: Self-pay | Admitting: Physician Assistant

## 2019-04-29 DIAGNOSIS — E785 Hyperlipidemia, unspecified: Secondary | ICD-10-CM | POA: Insufficient documentation

## 2019-05-22 DIAGNOSIS — Z01419 Encounter for gynecological examination (general) (routine) without abnormal findings: Secondary | ICD-10-CM | POA: Diagnosis not present

## 2019-05-22 DIAGNOSIS — Z6841 Body Mass Index (BMI) 40.0 and over, adult: Secondary | ICD-10-CM | POA: Diagnosis not present

## 2019-06-11 ENCOUNTER — Other Ambulatory Visit: Payer: Self-pay | Admitting: Physician Assistant

## 2019-06-11 DIAGNOSIS — E782 Mixed hyperlipidemia: Secondary | ICD-10-CM

## 2019-07-24 ENCOUNTER — Ambulatory Visit: Payer: BC Managed Care – PPO | Admitting: Physician Assistant

## 2019-07-29 ENCOUNTER — Ambulatory Visit: Payer: BC Managed Care – PPO | Admitting: Physician Assistant

## 2019-07-31 ENCOUNTER — Other Ambulatory Visit: Payer: Self-pay

## 2019-07-31 ENCOUNTER — Ambulatory Visit (INDEPENDENT_AMBULATORY_CARE_PROVIDER_SITE_OTHER): Payer: BC Managed Care – PPO | Admitting: Physician Assistant

## 2019-07-31 VITALS — BP 142/90 | HR 86 | Ht 64.0 in | Wt 262.0 lb

## 2019-07-31 DIAGNOSIS — E039 Hypothyroidism, unspecified: Secondary | ICD-10-CM

## 2019-07-31 DIAGNOSIS — E785 Hyperlipidemia, unspecified: Secondary | ICD-10-CM

## 2019-07-31 DIAGNOSIS — I10 Essential (primary) hypertension: Secondary | ICD-10-CM

## 2019-07-31 DIAGNOSIS — F419 Anxiety disorder, unspecified: Secondary | ICD-10-CM | POA: Diagnosis not present

## 2019-07-31 DIAGNOSIS — R7303 Prediabetes: Secondary | ICD-10-CM | POA: Diagnosis not present

## 2019-07-31 DIAGNOSIS — E782 Mixed hyperlipidemia: Secondary | ICD-10-CM

## 2019-07-31 DIAGNOSIS — Z79899 Other long term (current) drug therapy: Secondary | ICD-10-CM

## 2019-07-31 DIAGNOSIS — E1165 Type 2 diabetes mellitus with hyperglycemia: Secondary | ICD-10-CM

## 2019-07-31 DIAGNOSIS — E781 Pure hyperglyceridemia: Secondary | ICD-10-CM

## 2019-07-31 LAB — POCT GLYCOSYLATED HEMOGLOBIN (HGB A1C): Hemoglobin A1C: 6.7 % — AB (ref 4.0–5.6)

## 2019-07-31 MED ORDER — OZEMPIC (0.25 OR 0.5 MG/DOSE) 2 MG/1.5ML ~~LOC~~ SOPN: 0.5000 mg | PEN_INJECTOR | SUBCUTANEOUS | 2 refills | Status: DC

## 2019-07-31 MED ORDER — BUPROPION HCL ER (XL) 150 MG PO TB24: 150.0000 mg | ORAL_TABLET | Freq: Every day | ORAL | 3 refills | Status: DC

## 2019-07-31 MED ORDER — LIVALO 4 MG PO TABS: 1.0000 | ORAL_TABLET | Freq: Every day | ORAL | 1 refills | Status: DC

## 2019-07-31 NOTE — Progress Notes (Signed)
Acute Office Visit  Subjective:    Patient ID: Kelly Reeves, female    DOB: 01-06-1966, 54 y.o.   MRN: [id number]  Chief Complaint  Patient presents with  . Follow-up    HPI Patient is a 54 yo obese female with  pre-diabeties, HTN, hypothyroidism, HLD who is in today for 3 mo med check after beginning on Metformin.   Restarted OCP [facility]. Periods were sporadic and wanted to regulate them  SE: stomach upset on Metformin. Only takes Metformin during the day not at night bc she forgets to take with meal  HA a lot recently, Ibuprofen helps. Haven't had one since stopped drinking Diet Pepsi  Diet: cycles b/w really good and bad Exercise: not much, doesn't mind walking. Talked about starting exercise.  Past Medical History:  Diagnosis Date  . Hyperlipidemia   . Hypertension   . PCOS (polycystic ovarian syndrome)   . Thyroid disease     Past Surgical History:  Procedure Laterality Date  . CESAREAN SECTION  01/31/00    Family History  Problem Relation Age of Onset  . Depression Mother   . Melanoma Mother   . Hyperlipidemia Maternal Grandmother   . Depression Maternal Grandmother   . Colon cancer Maternal Grandmother   . Hyperlipidemia Maternal Grandfather   . Depression Maternal Grandfather   . Hyperlipidemia Paternal Grandmother   . Hypertension Paternal Grandmother   . Hyperlipidemia Paternal Grandfather   . Hypertension Paternal Grandfather   . Heart attack Brother   . Cancer Father        acute myeloid leukemia  . Breast cancer Paternal Aunt     Social History   Socioeconomic History  . Marital status: Married    Spouse name: Not on file  . Number of children: Not on file  . Years of education: Not on file  . Highest education level: Not on file  Occupational History  . Not on file  Tobacco Use  . Smoking status: Never Smoker  . Smokeless tobacco: Never Used  Substance and Sexual Activity  . Alcohol use: No  . Drug use: No  . Sexual activity:  Yes    Partners: Male    Birth control/protection: Pill  Other Topics Concern  . Not on file  Social History Narrative  . Not on file   Social Determinants of Health   Financial Resource Strain:   . Difficulty of Paying Living Expenses: Not on file  Food Insecurity:   . Worried About Programme researcher, broadcasting/film/video in the Last Year: Not on file  . Ran Out of Food in the Last Year: Not on file  Transportation Needs:   . Lack of Transportation (Medical): Not on file  . Lack of Transportation (Non-Medical): Not on file  Physical Activity:   . Days of Exercise per Week: Not on file  . Minutes of Exercise per Session: Not on file  Stress:   . Feeling of Stress : Not on file  Social Connections:   . Frequency of Communication with Friends and Family: Not on file  . Frequency of Social Gatherings with Friends and Family: Not on file  . Attends Religious Services: Not on file  . Active Member of Clubs or Organizations: Not on file  . Attends Banker Meetings: Not on file  . Marital Status: Not on file  Intimate Partner Violence:   . Fear of Current or Ex-Partner: Not on file  . Emotionally Abused: Not on file  .  Physically Abused: Not on file  . Sexually Abused: Not on file    Outpatient Medications Prior to Visit  Medication Sig Dispense Refill  . buPROPion (WELLBUTRIN XL) 150 MG 24 hr tablet Take 1 tablet (150 mg total) by mouth daily. 90 tablet 3  . hydrochlorothiazide (HYDRODIURIL) 25 MG tablet Take 1 tablet (25 mg total) by mouth daily. 90 tablet 4  . levothyroxine (SYNTHROID, LEVOTHROID) 125 MCG tablet Take 1 tablet (125 mcg total) by mouth daily. 90 tablet 3  . metFORMIN (GLUCOPHAGE) 500 MG tablet Take 1 tablet (500 mg total) by mouth 2 (two) times daily with a meal. 180 tablet 1  . Pitavastatin Calcium (LIVALO) 4 MG TABS Take 1 tablet (4 mg total) by mouth daily. NEED LABS 90 tablet 0   No facility-administered medications prior to visit.    Allergies  Allergen  Reactions  . Atorvastatin Other (See Comments)    Leg pain  . Prednisone     Itching, red rash  . Lisinopril Rash    Review of Systems  All other systems reviewed and are negative.      Objective:    Physical Exam Vitals reviewed.  Constitutional:      Appearance: Normal appearance. She is obese.  HENT:     Head: Normocephalic.  Cardiovascular:     Rate and Rhythm: Normal rate and regular rhythm.     Pulses: Normal pulses.     Heart sounds: No murmur.  Pulmonary:     Effort: Pulmonary effort is normal.     Breath sounds: Normal breath sounds.  Musculoskeletal:     Cervical back: Normal range of motion and neck supple. No tenderness.  Lymphadenopathy:     Cervical: No cervical adenopathy.  Neurological:     General: No focal deficit present.     Mental Status: She is alert and oriented to person, place, and time.  Psychiatric:        Mood and Affect: Mood normal.     BP (!) 149/84   Pulse 86   Ht 5\' 4"  (1.626 m)   Wt 118.8 kg   SpO2 98%   BMI 44.97 kg/m  Wt Readings from Last 3 Encounters:  07/31/19 118.8 kg  04/23/19 118.8 kg  09/19/18 113.9 kg   .. Results for orders placed or performed in visit on 07/31/19  Lipid Panel w/reflex Direct LDL  Result Value Ref Range   Cholesterol 280 (H) <200 mg/dL   HDL 43 (L) > OR = 50 mg/dL   Triglycerides 08/02/19 (H) <150 mg/dL   LDL Cholesterol (Calc)  mg/dL (calc)   Total CHOL/HDL Ratio 6.5 (H) <5.0 (calc)   Non-HDL Cholesterol (Calc) 237 (H) <130 mg/dL (calc)  COMPLETE METABOLIC PANEL WITH GFR  Result Value Ref Range   Glucose, Bld 146 (H) 65 - 99 mg/dL   BUN 10 7 - 25 mg/dL   Creat 086 5.78 - 4.69 mg/dL   GFR, Est Non African American 103 > OR = 60 mL/min/1.40m2   GFR, Est African American 120 > OR = 60 mL/min/1.91m2   BUN/Creatinine Ratio NOT APPLICABLE 6 - 22 (calc)   Sodium 136 135 - 146 mmol/L   Potassium 3.9 3.5 - 5.3 mmol/L   Chloride 100 98 - 110 mmol/L   CO2 25 20 - 32 mmol/L   Calcium 9.6 8.6 -  10.4 mg/dL   Total Protein 7.1 6.1 - 8.1 g/dL   Albumin 4.3 3.6 - 5.1 g/dL   Globulin 2.8 1.9 -  3.7 g/dL (calc)   AG Ratio 1.5 1.0 - 2.5 (calc)   Total Bilirubin 0.6 0.2 - 1.2 mg/dL   Alkaline phosphatase (APISO) 92 37 - 153 U/L   AST 45 (H) 10 - 35 U/L   ALT 43 (H) 6 - 29 U/L  TSH  Result Value Ref Range   TSH 2.35 mIU/L  DIRECT LDL  Result Value Ref Range   Direct LDL 159 (H) <100 mg/dL  POCT glycosylated hemoglobin (Hb A1C)  Result Value Ref Range   Hemoglobin A1C 6.7 (A) 4.0 - 5.6 %   HbA1c POC (<> result, manual entry)     HbA1c, POC (prediabetic range)     HbA1c, POC (controlled diabetic range)     .[name] Depression screen [facility] 2/9 04/23/2019 09/19/2018 12/13/2017 02/22/2017  Decreased Interest 0 0 0 0  Down, Depressed, Hopeless 0 0 0 0  PHQ - 2 Score 0 0 0 0  Altered sleeping 0 0 - -  Tired, decreased energy 0 0 - -  Change in appetite 1 0 - -  Feeling bad or failure about yourself  0 0 - -  Trouble concentrating 0 0 - -  Moving slowly or fidgety/restless 0 0 - -  Suicidal thoughts 0 0 - -  PHQ-9 Score 1 0 - -  Difficult doing work/chores Not difficult at all Not difficult at all - -   .[name] GAD 7 : Generalized Anxiety Score 04/23/2019 09/19/2018  Nervous, Anxious, on Edge 0 0  Control/stop worrying 0 0  Worry too much - different things 0 0  Trouble relaxing 0 0  Restless 0 0  Easily annoyed or irritable 0 0  Afraid - awful might happen 0 0  Total GAD 7 Score 0 0  Anxiety Difficulty Not difficult at all Not difficult at all         Assessment & Plan:  Marland KitchenMarland KitchenJassmine was seen today for follow-up.  Diagnoses and all orders for this visit:  Type 2 diabetes mellitus with hyperglycemia, without long-term current use of insulin (HCC) -     Semaglutide,0.25 or 0.5MG /DOS, (OZEMPIC, 0.25 OR 0.5 MG/DOSE,) 2 MG/1.5ML SOPN; Inject 0.5 mg into the skin once a week.  Pre-diabetes -     POCT glycosylated hemoglobin (Hb A1C)  Anxiety -     buPROPion (WELLBUTRIN XL) 150 MG 24 hr  tablet; Take 1 tablet (150 mg total) by mouth daily.  Mixed hyperlipidemia -     Lipid Panel w/reflex Direct LDL -     COMPLETE METABOLIC PANEL WITH GFR -     Pitavastatin Calcium (LIVALO) 4 MG TABS; Take 1 tablet (4 mg total) by mouth daily. -     DIRECT LDL  Medication management -     Lipid Panel w/reflex Direct LDL -     COMPLETE METABOLIC PANEL WITH GFR -     TSH  Acquired hypothyroidism -     TSH  Essential hypertension  Dyslipidemia -     DIRECT LDL  Morbid obesity (HCC)  Hypertriglyceridemia -     DIRECT LDL   a1c is not stable. She is not in diabetes range with metformin. Discussed options. Started ozempic. Discussed how to use. Start 2.5mg  weekly then increase to .5mg  as tolerated. Discussed diet and exercise. Discussed risk of complications of diabetes. Discussed new goals.  BP not to goal. She did not want to start ACE today. She would like to try weight loss first and better DM control after starting ozempic.  On STATIN.  Per pt taking livalo.recheck lipids today.  Flu and pnuemonia UTD.  Will recheck TSH and adjust accordingly.   Mood good. Refilled wellbutrin today.    Marland KitchenVernetta Honey PA-C, have reviewed and agree with the above documentation in it's entirety.

## 2019-08-01 LAB — COMPLETE METABOLIC PANEL WITH GFR
AG Ratio: 1.5 (calc) (ref 1.0–2.5)
ALT: 43 U/L — ABNORMAL HIGH (ref 6–29)
AST: 45 U/L — ABNORMAL HIGH (ref 10–35)
Albumin: 4.3 g/dL (ref 3.6–5.1)
Alkaline phosphatase (APISO): 92 U/L (ref 37–153)
BUN: 10 mg/dL (ref 7–25)
CO2: 25 mmol/L (ref 20–32)
Calcium: 9.6 mg/dL (ref 8.6–10.4)
Chloride: 100 mmol/L (ref 98–110)
Creat: 0.63 mg/dL (ref 0.50–1.05)
GFR, Est African American: 120 mL/min/{1.73_m2} (ref >=60)
GFR, Est Non African American: 103 mL/min/{1.73_m2} (ref >=60)
Globulin: 2.8 g/dL (calc) (ref 1.9–3.7)
Glucose, Bld: 146 mg/dL — ABNORMAL HIGH (ref 65–99)
Potassium: 3.9 mmol/L (ref 3.5–5.3)
Sodium: 136 mmol/L (ref 135–146)
Total Bilirubin: 0.6 mg/dL (ref 0.2–1.2)
Total Protein: 7.1 g/dL (ref 6.1–8.1)

## 2019-08-01 LAB — LIPID PANEL W/REFLEX DIRECT LDL
Cholesterol: 280 mg/dL — ABNORMAL HIGH (ref <200)
HDL: 43 mg/dL — ABNORMAL LOW (ref >=50)
Non-HDL Cholesterol (Calc): 237 mg/dL (calc) — ABNORMAL HIGH (ref <130)
Total CHOL/HDL Ratio: 6.5 (calc) — ABNORMAL HIGH (ref <5.0)
Triglycerides: 505 mg/dL — ABNORMAL HIGH (ref <150)

## 2019-08-01 LAB — DIRECT LDL: Direct LDL: 159 mg/dL — ABNORMAL HIGH (ref <100)

## 2019-08-01 LAB — TSH: TSH: 2.35 mIU/L

## 2019-08-02 ENCOUNTER — Encounter: Payer: Self-pay | Admitting: Physician Assistant

## 2019-08-02 ENCOUNTER — Other Ambulatory Visit: Payer: Self-pay | Admitting: Physician Assistant

## 2019-08-02 DIAGNOSIS — E1165 Type 2 diabetes mellitus with hyperglycemia: Secondary | ICD-10-CM

## 2019-08-02 DIAGNOSIS — F419 Anxiety disorder, unspecified: Secondary | ICD-10-CM | POA: Insufficient documentation

## 2019-08-02 HISTORY — DX: Type 2 diabetes mellitus with hyperglycemia: E11.65

## 2019-08-02 NOTE — Progress Notes (Signed)
Berenize,   Cholesterol has worsened quite a bit. You ARE taking livalo 4mg  daily?  TG are very elevated.  LDL is 159 where goal is under 70.   If you are taking livalo then we need to make some other major adjustments to help decrease CV risk.   Thyroid normal. Kidney looks good. Liver function stable.

## 2019-08-05 ENCOUNTER — Other Ambulatory Visit: Payer: Self-pay | Admitting: Physician Assistant

## 2019-08-05 MED ORDER — ICOSAPENT ETHYL 1 G PO CAPS: 2.0000 g | ORAL_CAPSULE | Freq: Two times a day (BID) | ORAL | 11 refills | Status: DC

## 2019-08-05 NOTE — Progress Notes (Signed)
Continue on livalo.  Will add fish oil vascepa to help get TG levels down.  Would like you to consider a bi-weekly injection for cholesterol to try to get you to goal of LDL under 70? I think that is the best option to get you to goals I am ok with waiting until we recheck A1C to discuss it again. I know it is a lot of information.   Lesly Rubenstein

## 2019-10-03 ENCOUNTER — Other Ambulatory Visit: Payer: Self-pay | Admitting: Physician Assistant

## 2019-10-03 DIAGNOSIS — E039 Hypothyroidism, unspecified: Secondary | ICD-10-CM

## 2019-10-16 ENCOUNTER — Other Ambulatory Visit: Payer: Self-pay | Admitting: Physician Assistant

## 2019-10-16 DIAGNOSIS — R7303 Prediabetes: Secondary | ICD-10-CM

## 2019-10-23 ENCOUNTER — Ambulatory Visit (INDEPENDENT_AMBULATORY_CARE_PROVIDER_SITE_OTHER): Payer: BC Managed Care – PPO | Admitting: Physician Assistant

## 2019-10-23 ENCOUNTER — Encounter: Payer: Self-pay | Admitting: Physician Assistant

## 2019-10-23 ENCOUNTER — Other Ambulatory Visit: Payer: Self-pay

## 2019-10-23 VITALS — BP 135/84 | HR 86 | Ht 64.0 in | Wt 251.0 lb

## 2019-10-23 DIAGNOSIS — E039 Hypothyroidism, unspecified: Secondary | ICD-10-CM | POA: Diagnosis not present

## 2019-10-23 DIAGNOSIS — R7303 Prediabetes: Secondary | ICD-10-CM

## 2019-10-23 DIAGNOSIS — E1165 Type 2 diabetes mellitus with hyperglycemia: Secondary | ICD-10-CM | POA: Diagnosis not present

## 2019-10-23 DIAGNOSIS — I10 Essential (primary) hypertension: Secondary | ICD-10-CM

## 2019-10-23 DIAGNOSIS — E538 Deficiency of other specified B group vitamins: Secondary | ICD-10-CM

## 2019-10-23 LAB — POCT UA - MICROALBUMIN
Albumin/Creatinine Ratio, Urine, POC: <30
Creatinine, POC: 200 mg/dL
Microalbumin Ur, POC: 30 mg/L

## 2019-10-23 MED ORDER — METFORMIN HCL 500 MG PO TABS: 500.0000 mg | ORAL_TABLET | Freq: Two times a day (BID) | ORAL | 2 refills | Status: DC

## 2019-10-23 MED ORDER — OZEMPIC (1 MG/DOSE) 2 MG/1.5ML ~~LOC~~ SOPN: 1.0000 mg | PEN_INJECTOR | SUBCUTANEOUS | 0 refills | Status: DC

## 2019-10-23 NOTE — Progress Notes (Signed)
   Subjective:    Patient ID: Kelly Reeves, female    DOB: 1965-09-30, 54 y.o.   MRN: [id number]  HPI  Pt is a 54 yo obese female with T2DM, HTN, hypothyroidism, PCOS, Dyslipidemia who presents to the clinic for 3 month follow up and medication refills.   She is doing well. She is exercising more. She is trying to keep meals small and not eat after 8. She is taking ozempic and metformin. She did have some GERD and nausea at first but then got used to it. No other issues. No hypoglycemia. No open sores or wounds. No CP, palpitations, headaches or vision changes.   Last A!C was: .[name] Lab Results  Component Value Date   HGBA1C 6.7 (A) 07/31/2019   This was before oxzempic was started.    Review of Systems  All other systems reviewed and are negative.      Objective:   Physical Exam Vitals reviewed.  Constitutional:      Appearance: Normal appearance.  Neck:     Vascular: No carotid bruit.  Cardiovascular:     Rate and Rhythm: Normal rate and regular rhythm.     Pulses: Normal pulses.  Pulmonary:     Effort: Pulmonary effort is normal.     Breath sounds: Normal breath sounds.  Musculoskeletal:     Right lower leg: No edema.     Left lower leg: No edema.  Neurological:     General: No focal deficit present.     Mental Status: She is alert and oriented to person, place, and time.  Psychiatric:        Mood and Affect: Mood normal.           Assessment & Plan:  Marland KitchenMarland KitchenNataliah was seen today for diabetes.  Diagnoses and all orders for this visit:  Type 2 diabetes mellitus with hyperglycemia, without long-term current use of insulin (HCC) -     POCT UA - Microalbumin -     Semaglutide, 1 MG/DOSE, (OZEMPIC, 1 MG/DOSE,) 2 MG/1.5ML SOPN; Inject 1 mg into the skin once a week. -     metFORMIN (GLUCOPHAGE) 500 MG tablet; Take 1 tablet (500 mg total) by mouth 2 (two) times daily with a meal.  Acquired hypothyroidism  Essential hypertension  B12 deficiency   Too early for  A1C.  Decided to recheck in 3 months.  Continue on metformin.  Increased ozempic to 1mg .  Continue to work on healthy diet and exercise.  .. Results for orders placed or performed in visit on 10/23/19  POCT UA - Microalbumin  Result Value Ref Range   Microalbumin Ur, POC 30 mg/L   Creatinine, POC 200 mg/dL   Albumin/Creatinine Ratio, Urine, POC <30    .10/25/19 Diabetic Foot Exam - Simple   Simple Foot Form Diabetic Foot exam was performed with the following findings: Yes 10/23/2019 11:15 AM  Visual Inspection No deformities, no ulcerations, no other skin breakdown bilaterally: Yes Sensation Testing Intact to touch and monofilament testing bilaterally: Yes Pulse Check Posterior Tibialis and Dorsalis pulse intact bilaterally: Yes Comments    Declined pneumonia vaccine.  UTD on flu shot.  Follow up in 3 months.

## 2019-10-30 ENCOUNTER — Ambulatory Visit: Payer: BC Managed Care – PPO | Admitting: Physician Assistant

## 2020-01-02 DIAGNOSIS — L603 Nail dystrophy: Secondary | ICD-10-CM | POA: Diagnosis not present

## 2020-01-02 DIAGNOSIS — L249 Irritant contact dermatitis, unspecified cause: Secondary | ICD-10-CM | POA: Diagnosis not present

## 2020-01-12 ENCOUNTER — Other Ambulatory Visit: Payer: Self-pay | Admitting: Physician Assistant

## 2020-01-12 DIAGNOSIS — E1165 Type 2 diabetes mellitus with hyperglycemia: Secondary | ICD-10-CM

## 2020-01-13 ENCOUNTER — Telehealth: Payer: Self-pay | Admitting: Physician Assistant

## 2020-01-13 NOTE — Telephone Encounter (Signed)
Received fax for PA on Ozempic sent through cover my meds and received authorization.   Case ID: [id number] Valid 12/14/2019 - 01/12/2023

## 2020-01-13 NOTE — Telephone Encounter (Signed)
Received Fax 7/9 for PA on Livalo sent through cover my meds and received authorization.   Case ID: [id number] Valid: 12/11/2019 - 01/09/2021 - CF

## 2020-01-22 ENCOUNTER — Ambulatory Visit: Payer: BC Managed Care – PPO | Admitting: Physician Assistant

## 2020-01-26 DIAGNOSIS — I10 Essential (primary) hypertension: Secondary | ICD-10-CM | POA: Diagnosis not present

## 2020-01-26 DIAGNOSIS — T7840XA Allergy, unspecified, initial encounter: Secondary | ICD-10-CM | POA: Diagnosis not present

## 2020-01-26 DIAGNOSIS — R21 Rash and other nonspecific skin eruption: Secondary | ICD-10-CM | POA: Diagnosis not present

## 2020-01-26 DIAGNOSIS — Z79899 Other long term (current) drug therapy: Secondary | ICD-10-CM | POA: Diagnosis not present

## 2020-01-26 DIAGNOSIS — Z888 Allergy status to other drugs, medicaments and biological substances status: Secondary | ICD-10-CM | POA: Diagnosis not present

## 2020-01-26 DIAGNOSIS — T7849XA Other allergy, initial encounter: Secondary | ICD-10-CM | POA: Diagnosis not present

## 2020-01-26 DIAGNOSIS — X58XXXA Exposure to other specified factors, initial encounter: Secondary | ICD-10-CM | POA: Diagnosis not present

## 2020-02-19 ENCOUNTER — Ambulatory Visit: Payer: BC Managed Care – PPO | Admitting: Physician Assistant

## 2020-03-25 ENCOUNTER — Encounter: Payer: Self-pay | Admitting: Physician Assistant

## 2020-03-25 ENCOUNTER — Other Ambulatory Visit: Payer: Self-pay

## 2020-03-25 ENCOUNTER — Ambulatory Visit (INDEPENDENT_AMBULATORY_CARE_PROVIDER_SITE_OTHER): Payer: BC Managed Care – PPO | Admitting: Physician Assistant

## 2020-03-25 VITALS — BP 143/74 | HR 85 | Wt 255.0 lb

## 2020-03-25 DIAGNOSIS — R1013 Epigastric pain: Secondary | ICD-10-CM

## 2020-03-25 DIAGNOSIS — R110 Nausea: Secondary | ICD-10-CM | POA: Insufficient documentation

## 2020-03-25 DIAGNOSIS — I10 Essential (primary) hypertension: Secondary | ICD-10-CM

## 2020-03-25 DIAGNOSIS — Z23 Encounter for immunization: Secondary | ICD-10-CM

## 2020-03-25 DIAGNOSIS — E1165 Type 2 diabetes mellitus with hyperglycemia: Secondary | ICD-10-CM

## 2020-03-25 DIAGNOSIS — Z7189 Other specified counseling: Secondary | ICD-10-CM

## 2020-03-25 DIAGNOSIS — K8020 Calculus of gallbladder without cholecystitis without obstruction: Secondary | ICD-10-CM

## 2020-03-25 DIAGNOSIS — Z7185 Encounter for immunization safety counseling: Secondary | ICD-10-CM

## 2020-03-25 LAB — POCT GLYCOSYLATED HEMOGLOBIN (HGB A1C): Hemoglobin A1C: 7 % — AB (ref 4.0–5.6)

## 2020-03-25 MED ORDER — OZEMPIC (0.25 OR 0.5 MG/DOSE) 2 MG/1.5ML ~~LOC~~ SOPN: 0.5000 mg | PEN_INJECTOR | SUBCUTANEOUS | 2 refills | Status: DC

## 2020-03-25 NOTE — Progress Notes (Signed)
Subjective:    Patient ID: Kelly Reeves, female    DOB: 1966/05/24, 54 y.o.   MRN: [id number]  HPI  Patient is a 54 year old obese female with hypertension, hypothyroidism, PCOS, type 2 diabetes, dyslipidemia who presents to the clinic for 62-month follow-up and medication refill  She admits she has avoided coming in because she has not been doing very well with her diet and exercise.  In fact she has gained weight.  She was started on Ozempic and then really well but we increase it to 1 mg and she got sick with that dose.  She stopped Ozempic and did not let anyone know.  She did tolerate the 0.5 mg fine.  Over the last month she has had a lot of epigastric pain.  She describes it just under her breastbone and radiates to the back.  She is nauseated but no vomiting.  She has a history of gallstones but she is not had any pain in about 6 years.  Patient is not checking her blood pressure regularly at home.  She denies any headaches, vision changes, palpitations, chest pain.  She is only taken HCTZ daily.  Patient is not checking her sugars at home either.  She denies any hypoglycemic events.  She denies any open sores or wounds.  She does take her Synthroid daily.  .. Active Ambulatory Problems    Diagnosis Date Noted  . Hypothyroidism 11/10/2010  . Hyperlipidemia 10/22/2011  . Essential hypertension 10/22/2011  . PCOS (polycystic ovarian syndrome) 10/22/2011  . Severe obesity (BMI >= 40) (HCC) 10/24/2012  . Obesity, Class III, BMI 40-49.9 (morbid obesity) (HCC) 08/21/2013  . Onychomycosis 08/21/2013  . Obesity 10/16/2014  . Thyroid activity decreased 10/16/2014  . Abnormal weight gain 10/16/2014  . Rash and nonspecific skin eruption 07/15/2015  . Pre-diabetes 12/18/2015  . Elevated liver enzymes 12/18/2015  . Right ankle pain 12/29/2015  . Gallstones 12/30/2015  . Large breasts 02/17/2016  . Morbid obesity (HCC) 05/18/2016  . Hematuria 02/22/2017  . Metabolic syndrome  12/21/2017  . B12 deficiency 04/23/2019  . Dyslipidemia 04/29/2019  . Type 2 diabetes mellitus with hyperglycemia, without long-term current use of insulin (HCC) 08/02/2019  . Anxiety 08/02/2019  . Epigastric abdominal pain 03/25/2020  . Nausea 03/25/2020   Resolved Ambulatory Problems    Diagnosis Date Noted  . Hypothyroidism 11/04/2011  . Depression 01/13/2016   Past Medical History:  Diagnosis Date  . Hypertension   . Thyroid disease      Review of Systems  All other systems reviewed and are negative.      Objective:   Physical Exam Vitals reviewed.  Constitutional:      Appearance: Normal appearance.  HENT:     Head: Normocephalic.  Cardiovascular:     Rate and Rhythm: Normal rate and regular rhythm.     Pulses: Normal pulses.  Pulmonary:     Effort: Pulmonary effort is normal.     Breath sounds: Normal breath sounds.  Neurological:     General: No focal deficit present.     Mental Status: She is alert and oriented to person, place, and time.  Psychiatric:        Mood and Affect: Mood normal.           Assessment & Plan:  Marland KitchenMarland KitchenLetisia was seen today for diabetes.  Diagnoses and all orders for this visit:  Type 2 diabetes mellitus with hyperglycemia, without long-term current use of insulin (HCC) -     POCT  glycosylated hemoglobin (Hb A1C) -     Semaglutide,0.25 or 0.5MG /DOS, (OZEMPIC, 0.25 OR 0.5 MG/DOSE,) 2 MG/1.5ML SOPN; Inject 0.5 mg into the skin once a week. -     COMPLETE METABOLIC PANEL WITH GFR -     CBC -     Lipase  Epigastric abdominal pain -     COMPLETE METABOLIC PANEL WITH GFR -     CBC -     Lipase  Flu vaccine need -     Flu Vaccine QUAD 36+ mos IM  Severe obesity (BMI >= 40) (HCC)  Vaccine counseling  Gallstones  Nausea  Essential hypertension    Results for orders placed or performed in visit on 03/25/20  POCT glycosylated hemoglobin (Hb A1C)  Result Value Ref Range   Hemoglobin A1C 7.0 (A) 4.0 - 5.6 %   HbA1c POC  (<> result, manual entry)     HbA1c, POC (prediabetic range)     HbA1c, POC (controlled diabetic range)     .[name] Lab Results  Component Value Date   HGBA1C 7.0 (A) 03/25/2020   Patient's A1c today is 7 she was 6.7 at last visit. I think Ozempic could be great for her weight loss and sugar control.  We will consider sending over the 0.5 weekly dose pending her epigastric pain is not worsened and her labs checked out. Patient is not on ACE.  Her blood pressure is not to goal.  Patient is resistant to going on any more medicine.  She will check her blood pressures at home and report these readings back to me.  Discussed the urgency of treating elevated blood pressure to protect kidneys. Patient is on Livalo.  Up-to-date foot exam Up-to-date eye exam Patient did get flu shot today.  She declines pneumonia vaccine.  She declines Covid vaccine.  I did discuss with her benefits of Covid vaccine.  She will consider.  I do have some concerns for pancreatitis due to her explanation of symptoms.  GLP-1's are also known to induce pancreatitis.  I will check her lipase, CMP, CBC today.  She did not have any abdominal tenderness over the right upper quadrant.  She does have a history of gallstones.  We should consider an abdominal ultrasound in the near future if pain does not resolve.  Patient is aware not to start Ozempic until we get labs.  We may need to consider Metformin/GLT to combination for diabetic control if she is not able to start GLP-1.  We did spend some time discussing weight.  I do think she is an excellent candidate for bariatric surgery.  She is still considering this.  Follow-up in 1 month to review blood pressure log and plan.

## 2020-03-26 LAB — COMPLETE METABOLIC PANEL WITH GFR
AG Ratio: 1.4 (calc) (ref 1.0–2.5)
ALT: 31 U/L — ABNORMAL HIGH (ref 6–29)
AST: 32 U/L (ref 10–35)
Albumin: 4.1 g/dL (ref 3.6–5.1)
Alkaline phosphatase (APISO): 91 U/L (ref 37–153)
BUN: 10 mg/dL (ref 7–25)
CO2: 27 mmol/L (ref 20–32)
Calcium: 9.4 mg/dL (ref 8.6–10.4)
Chloride: 99 mmol/L (ref 98–110)
Creat: 0.67 mg/dL (ref 0.50–1.05)
GFR, Est African American: 116 mL/min/{1.73_m2} (ref >=60)
GFR, Est Non African American: 100 mL/min/{1.73_m2} (ref >=60)
Globulin: 2.9 g/dL (calc) (ref 1.9–3.7)
Glucose, Bld: 146 mg/dL — ABNORMAL HIGH (ref 65–99)
Potassium: 3.8 mmol/L (ref 3.5–5.3)
Sodium: 137 mmol/L (ref 135–146)
Total Bilirubin: 0.4 mg/dL (ref 0.2–1.2)
Total Protein: 7 g/dL (ref 6.1–8.1)

## 2020-03-26 LAB — CBC
HCT: 42.3 % (ref 35.0–45.0)
Hemoglobin: 14.4 g/dL (ref 11.7–15.5)
MCH: 31.1 pg (ref 27.0–33.0)
MCHC: 34 g/dL (ref 32.0–36.0)
MCV: 91.4 fL (ref 80.0–100.0)
MPV: 9.8 fL (ref 7.5–12.5)
Platelets: 416 10*3/uL — ABNORMAL HIGH (ref 140–400)
RBC: 4.63 10*6/uL (ref 3.80–5.10)
RDW: 13.1 % (ref 11.0–15.0)
WBC: 7.6 10*3/uL (ref 3.8–10.8)

## 2020-03-26 LAB — LIPASE: Lipase: 27 U/L (ref 7–60)

## 2020-03-26 NOTE — Progress Notes (Signed)
Kelly Reeves,   Kidney and liver look good.  Lipase is normal and no signs of inflammation of pancreas.  Your upper abdominal pain is likely gastritis but could be your gallbladder as well. I do think getting another u/s to better evaluate is a great idea if pain keeps happening. But you can start ozempic again.

## 2020-04-12 ENCOUNTER — Other Ambulatory Visit: Payer: Self-pay | Admitting: Physician Assistant

## 2020-04-12 DIAGNOSIS — E782 Mixed hyperlipidemia: Secondary | ICD-10-CM

## 2020-06-24 ENCOUNTER — Ambulatory Visit: Payer: BC Managed Care – PPO | Admitting: Physician Assistant

## 2020-06-28 ENCOUNTER — Other Ambulatory Visit: Payer: Self-pay | Admitting: Physician Assistant

## 2020-06-28 DIAGNOSIS — I10 Essential (primary) hypertension: Secondary | ICD-10-CM

## 2020-08-17 ENCOUNTER — Other Ambulatory Visit: Payer: Self-pay | Admitting: Obstetrics and Gynecology

## 2020-08-17 DIAGNOSIS — Z1231 Encounter for screening mammogram for malignant neoplasm of breast: Secondary | ICD-10-CM

## 2020-08-26 ENCOUNTER — Ambulatory Visit
Admission: RE | Admit: 2020-08-26 | Discharge: 2020-08-26 | Disposition: A | Discharge status: Home / Self Care | Payer: BC Managed Care – PPO | Source: Ambulatory Visit | Attending: Obstetrics and Gynecology | Admitting: Obstetrics and Gynecology

## 2020-08-26 ENCOUNTER — Other Ambulatory Visit: Payer: Self-pay

## 2020-08-26 DIAGNOSIS — Z1231 Encounter for screening mammogram for malignant neoplasm of breast: Secondary | ICD-10-CM | POA: Diagnosis not present

## 2020-09-16 ENCOUNTER — Ambulatory Visit: Payer: BC Managed Care – PPO | Admitting: Physician Assistant

## 2020-09-21 ENCOUNTER — Other Ambulatory Visit: Payer: Self-pay | Admitting: Physician Assistant

## 2020-09-21 DIAGNOSIS — E782 Mixed hyperlipidemia: Secondary | ICD-10-CM

## 2020-09-24 ENCOUNTER — Other Ambulatory Visit: Payer: Self-pay | Admitting: Physician Assistant

## 2020-09-24 DIAGNOSIS — E039 Hypothyroidism, unspecified: Secondary | ICD-10-CM

## 2020-09-29 ENCOUNTER — Other Ambulatory Visit: Payer: Self-pay | Admitting: Physician Assistant

## 2020-09-29 DIAGNOSIS — I10 Essential (primary) hypertension: Secondary | ICD-10-CM

## 2020-10-03 ENCOUNTER — Other Ambulatory Visit: Payer: Self-pay | Admitting: Physician Assistant

## 2020-10-03 DIAGNOSIS — F419 Anxiety disorder, unspecified: Secondary | ICD-10-CM

## 2020-10-14 ENCOUNTER — Ambulatory Visit: Payer: BC Managed Care – PPO | Admitting: Physician Assistant

## 2020-10-20 ENCOUNTER — Other Ambulatory Visit: Payer: Self-pay | Admitting: Neurology

## 2020-10-20 DIAGNOSIS — I10 Essential (primary) hypertension: Secondary | ICD-10-CM

## 2020-10-20 MED ORDER — HYDROCHLOROTHIAZIDE 25 MG PO TABS: 1.0000 | ORAL_TABLET | Freq: Every day | ORAL | 0 refills | Status: DC

## 2020-10-31 ENCOUNTER — Other Ambulatory Visit: Payer: Self-pay | Admitting: Physician Assistant

## 2020-10-31 DIAGNOSIS — E039 Hypothyroidism, unspecified: Secondary | ICD-10-CM

## 2020-11-11 ENCOUNTER — Ambulatory Visit: Payer: BC Managed Care – PPO | Admitting: Physician Assistant

## 2020-11-11 ENCOUNTER — Other Ambulatory Visit: Payer: Self-pay | Admitting: Physician Assistant

## 2020-11-11 DIAGNOSIS — Z01419 Encounter for gynecological examination (general) (routine) without abnormal findings: Secondary | ICD-10-CM | POA: Diagnosis not present

## 2020-11-11 DIAGNOSIS — Z6841 Body Mass Index (BMI) 40.0 and over, adult: Secondary | ICD-10-CM | POA: Diagnosis not present

## 2020-11-11 DIAGNOSIS — N760 Acute vaginitis: Secondary | ICD-10-CM | POA: Diagnosis not present

## 2020-11-11 DIAGNOSIS — I10 Essential (primary) hypertension: Secondary | ICD-10-CM

## 2020-11-11 LAB — HM PAP SMEAR: HM Pap smear: NEGATIVE

## 2020-11-18 ENCOUNTER — Encounter: Payer: Self-pay | Admitting: Physician Assistant

## 2020-11-18 ENCOUNTER — Ambulatory Visit (INDEPENDENT_AMBULATORY_CARE_PROVIDER_SITE_OTHER): Payer: BC Managed Care – PPO | Admitting: Physician Assistant

## 2020-11-18 ENCOUNTER — Other Ambulatory Visit: Payer: Self-pay

## 2020-11-18 VITALS — BP 140/81 | HR 90 | Ht 64.0 in | Wt 264.0 lb

## 2020-11-18 DIAGNOSIS — E1165 Type 2 diabetes mellitus with hyperglycemia: Secondary | ICD-10-CM | POA: Diagnosis not present

## 2020-11-18 DIAGNOSIS — E785 Hyperlipidemia, unspecified: Secondary | ICD-10-CM

## 2020-11-18 DIAGNOSIS — E039 Hypothyroidism, unspecified: Secondary | ICD-10-CM

## 2020-11-18 DIAGNOSIS — E782 Mixed hyperlipidemia: Secondary | ICD-10-CM

## 2020-11-18 DIAGNOSIS — F419 Anxiety disorder, unspecified: Secondary | ICD-10-CM | POA: Diagnosis not present

## 2020-11-18 DIAGNOSIS — Z23 Encounter for immunization: Secondary | ICD-10-CM

## 2020-11-18 DIAGNOSIS — I10 Essential (primary) hypertension: Secondary | ICD-10-CM

## 2020-11-18 DIAGNOSIS — Z6841 Body Mass Index (BMI) 40.0 and over, adult: Secondary | ICD-10-CM

## 2020-11-18 LAB — POCT GLYCOSYLATED HEMOGLOBIN (HGB A1C): Hemoglobin A1C: 6.4 % — AB (ref 4.0–5.6)

## 2020-11-18 LAB — POCT UA - MICROALBUMIN
Albumin/Creatinine Ratio, Urine, POC: <30
Creatinine, POC: 200 mg/dL
Microalbumin Ur, POC: 30 mg/L

## 2020-11-18 MED ORDER — LIVALO 4 MG PO TABS: 1.0000 | ORAL_TABLET | Freq: Every day | ORAL | 3 refills | Status: DC

## 2020-11-18 MED ORDER — OZEMPIC (0.25 OR 0.5 MG/DOSE) 2 MG/1.5ML ~~LOC~~ SOPN: 0.5000 mg | PEN_INJECTOR | SUBCUTANEOUS | 2 refills | Status: DC

## 2020-11-18 MED ORDER — BUPROPION HCL ER (XL) 150 MG PO TB24: 1.0000 | ORAL_TABLET | Freq: Every day | ORAL | 1 refills | Status: DC

## 2020-11-18 MED ORDER — HYDROCHLOROTHIAZIDE 25 MG PO TABS: 1.0000 | ORAL_TABLET | Freq: Every day | ORAL | 1 refills | Status: DC

## 2020-11-18 NOTE — Progress Notes (Signed)
Subjective:    Patient ID: Kelly Reeves, female    DOB: [birth year], 55 y.o.   MRN: [id number]  HPI  Patient is a 55 year old obese female with type 2 diabetes, hypertension, hypothyroidism, PCOS, hyperlipidemia who presents to the clinic for 38-month follow-up.  Patient admits she has not been completely compliant with her medications.  She often forgets taking all of them.  She denies monitoring her sugar or blood pressure.  She denies any chest pain, palpitations, headache, vision changes.  She admits she is not great with her diet and/or exercise regularly.  Her mood and energy are fair.  She denies any suicidal thoughts or homicidal idealizations.  .. Active Ambulatory Problems    Diagnosis Date Noted  . Hypothyroidism 11/10/2010  . Hyperlipidemia 10/22/2011  . Essential hypertension 10/22/2011  . PCOS (polycystic ovarian syndrome) 10/22/2011  . Severe obesity (BMI >= 40) (HCC) 10/24/2012  . Obesity, Class III, BMI 40-49.9 (morbid obesity) (HCC) 08/21/2013  . Onychomycosis 08/21/2013  . Class 3 severe obesity due to excess calories with serious comorbidity and body mass index (BMI) of 45.0 to 49.9 in adult (HCC) 10/16/2014  . Thyroid activity decreased 10/16/2014  . Abnormal weight gain 10/16/2014  . Rash and nonspecific skin eruption 07/15/2015  . Pre-diabetes 12/18/2015  . Elevated liver enzymes 12/18/2015  . Right ankle pain 12/29/2015  . Gallstones 12/30/2015  . Large breasts 02/17/2016  . Morbid obesity (HCC) 05/18/2016  . Hematuria 02/22/2017  . Metabolic syndrome 12/21/2017  . B12 deficiency 04/23/2019  . Dyslipidemia 04/29/2019  . Type 2 diabetes mellitus with hyperglycemia, without long-term current use of insulin (HCC) 08/02/2019  . Anxiety 08/02/2019  . Epigastric abdominal pain 03/25/2020  . Nausea 03/25/2020   Resolved Ambulatory Problems    Diagnosis Date Noted  . Hypothyroidism 11/04/2011  . Depression 01/13/2016   Past Medical History:  Diagnosis  Date  . Hypertension   . Thyroid disease     Review of Systems  All other systems reviewed and are negative.      Objective:   Physical Exam Vitals reviewed.  Constitutional:      Appearance: Normal appearance. She is obese.  HENT:     Head: Normocephalic.  Cardiovascular:     Rate and Rhythm: Normal rate and regular rhythm.     Pulses: Normal pulses.     Heart sounds: Normal heart sounds.  Pulmonary:     Effort: Pulmonary effort is normal.     Breath sounds: Normal breath sounds.  Musculoskeletal:     Right lower leg: No edema.     Left lower leg: No edema.  Neurological:     General: No focal deficit present.     Mental Status: She is alert and oriented to person, place, and time.  Psychiatric:        Mood and Affect: Mood normal.    .. Depression screen [facility] 2/9 11/18/2020 03/25/2020 04/23/2019 09/19/2018 12/13/2017  Decreased Interest 0 0 0 0 0  Down, Depressed, Hopeless 0 0 0 0 0  PHQ - 2 Score 0 0 0 0 0  Altered sleeping 0 0 0 0 -  Tired, decreased energy 0 0 0 0 -  Change in appetite 0 1 1 0 -  Feeling bad or failure about yourself  0 1 0 0 -  Trouble concentrating 0 0 0 0 -  Moving slowly or fidgety/restless 0 0 0 0 -  Suicidal thoughts 0 0 0 0 -  PHQ-9 Score 0 2  1 0 -  Difficult doing work/chores Not difficult at all Not difficult at all Not difficult at all Not difficult at all -   .. GAD 7 : Generalized Anxiety Score 11/18/2020 03/25/2020 04/23/2019 09/19/2018  Nervous, Anxious, on Edge 0 0 0 0  Control/stop worrying 0 0 0 0  Worry too much - different things 0 0 0 0  Trouble relaxing 0 0 0 0  Restless 0 0 0 0  Easily annoyed or irritable 0 0 0 0  Afraid - awful might happen 0 0 0 0  Total GAD 7 Score 0 0 0 0  Anxiety Difficulty Not difficult at all - Not difficult at all Not difficult at all          .[name] Lab Results  Component Value Date   HGBA1C 6.4 (A) 11/18/2020    Assessment & Plan:  Marland KitchenMarland KitchenRillie was seen today for diabetes.  Diagnoses and  all orders for this visit:  Type 2 diabetes mellitus with hyperglycemia, without long-term current use of insulin (HCC) -     POCT glycosylated hemoglobin (Hb A1C) -     POCT UA - Microalbumin -     Lipid Panel w/reflex Direct LDL -     Semaglutide,0.25 or 0.5MG /DOS, (OZEMPIC, 0.25 OR 0.5 MG/DOSE,) 2 MG/1.5ML SOPN; Inject 0.5 mg into the skin once a week.  Essential hypertension -     COMPLETE METABOLIC PANEL WITH GFR -     hydrochlorothiazide (HYDRODIURIL) 25 MG tablet; Take 1 tablet (25 mg total) by mouth daily.  Anxiety -     buPROPion (WELLBUTRIN XL) 150 MG 24 hr tablet; Take 1 tablet (150 mg total) by mouth daily.  Mixed hyperlipidemia -     Pitavastatin Calcium (LIVALO) 4 MG TABS; Take 1 tablet (4 mg total) by mouth daily.  Acquired hypothyroidism -     TSH  Dyslipidemia -     Lipid Panel w/reflex Direct LDL  Need for pneumococcal vaccination -     Pneumococcal conjugate vaccine 20-valent (Prevnar 20)  Class 3 severe obesity due to excess calories with serious comorbidity and body mass index (BMI) of 45.0 to 49.9 in adult (HCC)   A1c is to goal. Patient is not very compliant with her medications which I also think could help her lose weight.  Discussed importance of taking Ozempic consistently and weekly. Continue making healthy food choices and trying to cut down on weight. BP not to goal. Not on ACE.  microalbumin good.  On livalo.  Stressed importance of being compliant. Foot exam/eye exam UTD.  Pneumonia vaccine given today.  Declined covid vaccine.  Flu UTD.    Labs ordered for medication adjustment.

## 2020-12-03 ENCOUNTER — Other Ambulatory Visit: Payer: Self-pay | Admitting: Physician Assistant

## 2020-12-03 DIAGNOSIS — E039 Hypothyroidism, unspecified: Secondary | ICD-10-CM

## 2020-12-08 ENCOUNTER — Encounter: Payer: Self-pay | Admitting: Physician Assistant

## 2020-12-18 ENCOUNTER — Other Ambulatory Visit: Payer: Self-pay | Admitting: Physician Assistant

## 2020-12-18 DIAGNOSIS — E039 Hypothyroidism, unspecified: Secondary | ICD-10-CM

## 2020-12-26 ENCOUNTER — Other Ambulatory Visit: Payer: Self-pay | Admitting: Physician Assistant

## 2020-12-26 DIAGNOSIS — F419 Anxiety disorder, unspecified: Secondary | ICD-10-CM

## 2020-12-26 DIAGNOSIS — E782 Mixed hyperlipidemia: Secondary | ICD-10-CM

## 2021-01-05 ENCOUNTER — Other Ambulatory Visit: Payer: Self-pay | Admitting: Physician Assistant

## 2021-01-05 DIAGNOSIS — I10 Essential (primary) hypertension: Secondary | ICD-10-CM

## 2021-01-20 ENCOUNTER — Telehealth: Payer: Self-pay | Admitting: Neurology

## 2021-01-20 NOTE — Telephone Encounter (Signed)
Livalo PA was started in Covermymeds.   Additional Information Required "Drug is covered by current benefit plan. No further PA activity needed"

## 2021-01-27 DIAGNOSIS — E039 Hypothyroidism, unspecified: Secondary | ICD-10-CM | POA: Diagnosis not present

## 2021-01-27 DIAGNOSIS — E785 Hyperlipidemia, unspecified: Secondary | ICD-10-CM | POA: Diagnosis not present

## 2021-01-27 DIAGNOSIS — I10 Essential (primary) hypertension: Secondary | ICD-10-CM | POA: Diagnosis not present

## 2021-01-27 DIAGNOSIS — E1165 Type 2 diabetes mellitus with hyperglycemia: Secondary | ICD-10-CM | POA: Diagnosis not present

## 2021-01-28 ENCOUNTER — Telehealth: Payer: Self-pay | Admitting: Neurology

## 2021-01-28 ENCOUNTER — Other Ambulatory Visit: Payer: Self-pay | Admitting: Physician Assistant

## 2021-01-28 DIAGNOSIS — E039 Hypothyroidism, unspecified: Secondary | ICD-10-CM

## 2021-01-28 LAB — COMPLETE METABOLIC PANEL WITH GFR
AG Ratio: 1.3 (calc) (ref 1.0–2.5)
ALT: 37 U/L — ABNORMAL HIGH (ref 6–29)
AST: 58 U/L — ABNORMAL HIGH (ref 10–35)
Albumin: 4.2 g/dL (ref 3.6–5.1)
Alkaline phosphatase (APISO): 86 U/L (ref 37–153)
BUN: 12 mg/dL (ref 7–25)
CO2: 28 mmol/L (ref 20–32)
Calcium: 10.2 mg/dL (ref 8.6–10.4)
Chloride: 99 mmol/L (ref 98–110)
Creat: 0.75 mg/dL (ref 0.50–1.03)
Globulin: 3.2 g/dL (calc) (ref 1.9–3.7)
Glucose, Bld: 172 mg/dL — ABNORMAL HIGH (ref 65–99)
Potassium: 3.8 mmol/L (ref 3.5–5.3)
Sodium: 136 mmol/L (ref 135–146)
Total Bilirubin: 0.7 mg/dL (ref 0.2–1.2)
Total Protein: 7.4 g/dL (ref 6.1–8.1)
eGFR: 95 mL/min/{1.73_m2} (ref >=60)

## 2021-01-28 LAB — LIPID PANEL W/REFLEX DIRECT LDL
Cholesterol: 281 mg/dL — ABNORMAL HIGH (ref <200)
HDL: 39 mg/dL — ABNORMAL LOW (ref >=50)
Non-HDL Cholesterol (Calc): 242 mg/dL (calc) — ABNORMAL HIGH (ref <130)
Total CHOL/HDL Ratio: 7.2 (calc) — ABNORMAL HIGH (ref <5.0)
Triglycerides: 662 mg/dL — ABNORMAL HIGH (ref <150)

## 2021-01-28 LAB — DIRECT LDL: Direct LDL: 132 mg/dL — ABNORMAL HIGH (ref <100)

## 2021-01-28 LAB — TSH: TSH: 13.3 mIU/L — ABNORMAL HIGH

## 2021-01-28 MED ORDER — LEVOTHYROXINE SODIUM 125 MCG PO TABS: 125.0000 ug | ORAL_TABLET | Freq: Every day | ORAL | 0 refills | Status: DC

## 2021-01-28 MED ORDER — ICOSAPENT ETHYL 1 G PO CAPS: 2.0000 g | ORAL_CAPSULE | Freq: Two times a day (BID) | ORAL | 99 refills | Status: DC

## 2021-01-28 NOTE — Telephone Encounter (Signed)
Patient called back and states she has been off thyroid medication for one month. Resent medication in for patient. She will restart and have TSH drawn in 6 weeks. Lab ordered.

## 2021-01-28 NOTE — Progress Notes (Signed)
Kelly Reeves,   Are you taking thyroid medication daily alone with no other medication or food?  TSH  elevated indicating low thyroid.  TG very elevated. Not able to calculate true LDL. Direct LDL pending. TG over 500 need to start vascepa(prescription fish oil) to help lower. Are you taking livalo?  Liver enzymes are up some. Continue to avoid tylenol/alcohol and work on weight loss.

## 2021-01-29 NOTE — Progress Notes (Signed)
Kelly Reeves,   Direct LDL is 132 better than in the past but not to goal under 70.  If you are taking livalo. I think we might need to be more aggressive and consider adding something else. We can talk about this.

## 2021-02-17 ENCOUNTER — Ambulatory Visit: Payer: BC Managed Care – PPO | Admitting: Physician Assistant

## 2021-03-17 ENCOUNTER — Encounter: Payer: Self-pay | Admitting: Physician Assistant

## 2021-03-17 ENCOUNTER — Ambulatory Visit (INDEPENDENT_AMBULATORY_CARE_PROVIDER_SITE_OTHER): Payer: BC Managed Care – PPO | Admitting: Physician Assistant

## 2021-03-17 VITALS — BP 131/71 | HR 81 | Temp 98.7°F | Ht 64.0 in | Wt 251.0 lb

## 2021-03-17 DIAGNOSIS — F419 Anxiety disorder, unspecified: Secondary | ICD-10-CM

## 2021-03-17 DIAGNOSIS — Z23 Encounter for immunization: Secondary | ICD-10-CM

## 2021-03-17 DIAGNOSIS — Z6841 Body Mass Index (BMI) 40.0 and over, adult: Secondary | ICD-10-CM

## 2021-03-17 DIAGNOSIS — E1165 Type 2 diabetes mellitus with hyperglycemia: Secondary | ICD-10-CM | POA: Diagnosis not present

## 2021-03-17 DIAGNOSIS — E039 Hypothyroidism, unspecified: Secondary | ICD-10-CM | POA: Diagnosis not present

## 2021-03-17 DIAGNOSIS — I10 Essential (primary) hypertension: Secondary | ICD-10-CM

## 2021-03-17 LAB — POCT GLYCOSYLATED HEMOGLOBIN (HGB A1C): Hemoglobin A1C: 6.7 % — AB (ref 4.0–5.6)

## 2021-03-17 MED ORDER — SEMAGLUTIDE (1 MG/DOSE) 4 MG/3ML ~~LOC~~ SOPN: 1.0000 mg | PEN_INJECTOR | SUBCUTANEOUS | 0 refills | Status: DC

## 2021-03-17 NOTE — Patient Instructions (Signed)
Influenza (Flu) Vaccine (Inactivated or Recombinant): What You Need to Know 1. Why get vaccinated? Influenza vaccine can prevent influenza (flu). Flu is a contagious disease that spreads around the [place] every year, usually between October and May. Anyone can get the flu, but it is more dangerous for some people. Infants and young children, people 65 years and older, pregnant people, and people with certain health conditions or a weakened immune system are at greatest risk of flu complications. Pneumonia, bronchitis, sinus infections, and ear infections are examples of flu-related complications. If you have a medical condition, such as heart disease, cancer, or diabetes, flu can make it worse. Flu can cause fever and chills, sore throat, muscle aches, fatigue, cough, headache, and runny or stuffy nose. Some people may have vomiting and diarrhea, though this is more common in children than adults. In an average year, thousands of people in the [place] die from flu, and many more are hospitalized. Flu vaccine prevents millions of illnesses and flu-related visits to the doctor each year. 2. Influenza vaccines CDC recommends everyone 6 months and older get vaccinated every flu season. Children 6 months through 8 years of age may need 2 doses during a single flu season. Everyone else needs only 1 dose each flu season. It takes about 2 weeks for protection to develop after vaccination. There are many flu viruses, and they are always changing. Each year a new flu vaccine is made to protect against the influenza viruses believed to be likely to cause disease in the upcoming flu season. Even when the vaccine doesn't exactly match these viruses, it may still provide some protection. Influenza vaccine does not cause flu. Influenza vaccine may be given at the same time as other vaccines. 3. Talk with your health care provider Tell your vaccination provider if the person getting the vaccine: Has had  an allergic reaction after a previous dose of influenza vaccine, or has any severe, life-threatening allergies Has ever had Guillain-Barr Syndrome (also called "GBS") In some cases, your health care provider may decide to postpone influenza vaccination until a future visit. Influenza vaccine can be administered at any time during pregnancy. People who are or will be pregnant during influenza season should receive inactivated influenza vaccine. People with minor illnesses, such as a cold, may be vaccinated. People who are moderately or severely ill should usually wait until they recover before getting influenza vaccine. Your health care provider can give you more information. 4. Risks of a vaccine reaction Soreness, redness, and swelling where the shot is given, fever, muscle aches, and headache can happen after influenza vaccination. There may be a very small increased risk of Guillain-Barr Syndrome (GBS) after inactivated influenza vaccine (the flu shot). Young children who get the flu shot along with pneumococcal vaccine (PCV13) and/or DTaP vaccine at the same time might be slightly more likely to have a seizure caused by fever. Tell your health care provider if a child who is getting flu vaccine has ever had a seizure. People sometimes faint after medical procedures, including vaccination. Tell your provider if you feel dizzy or have vision changes or ringing in the ears. As with any medicine, there is a very remote chance of a vaccine causing a severe allergic reaction, other serious injury, or death. 5. What if there is a serious problem? An allergic reaction could occur after the vaccinated person leaves the clinic. If you see signs of a severe allergic reaction (hives, swelling of the face and throat, difficulty breathing,   a fast heartbeat, dizziness, or weakness), call 9-1-1 and get the person to the nearest hospital. For other signs that concern you, call your health care provider. Adverse  reactions should be reported to the Vaccine Adverse Event Reporting System (VAERS). Your health care provider will usually file this report, or you can do it yourself. Visit the VAERS website at www.vaers.hhs.gov or call 1-800-822-7967. VAERS is only for reporting reactions, and VAERS staff members do not give medical advice. 6. The National Vaccine Injury Compensation Program The National Vaccine Injury Compensation Program (VICP) is a federal program that was created to compensate people who may have been injured by certain vaccines. Claims regarding alleged injury or death due to vaccination have a time limit for filing, which may be as short as two years. Visit the VICP website at www.hrsa.gov/vaccinecompensation or call 1-800-338-2382 to learn about the program and about filing a claim. 7. How can I learn more? Ask your health care provider. Call your local or state health department. Visit the website of the Food and Drug Administration (FDA) for vaccine package inserts and additional information at www.fda.gov/vaccines-blood-biologics/vaccines. Contact the Centers for Disease Control and Prevention (CDC): Call 1-800-232-4636 (1-800-CDC-INFO) or Visit CDC's website at www.cdc.gov/flu. Vaccine Information Statement Inactivated Influenza Vaccine (02/07/2020) This information is not intended to replace advice given to you by your health care provider. Make sure you discuss any questions you have with your health care provider. Document Revised: 03/26/2020 Document Reviewed: 03/26/2020 Elsevier Patient Education  2022 Elsevier Inc.  

## 2021-03-17 NOTE — Progress Notes (Signed)
Subjective:    Patient ID: Kelly Reeves, female    DOB: 03-Oct-1965, 55 y.o.   MRN: [id number]  HPI Pt is a 55 yo obese female with T2DM, HTN, hypothyroidism, PCOS, HLD who presents to the clinic for follow up.   Pt is taking her ozempic only daily at .25 and doing great. Tolerating well. No side effects. She has lost 13lbs. She is walking more and eating less. No CP, palpitations, headaches or vision changes. No hypoglycemic events.   Taking increased dose of levothyroxine with no problems. Does feel more energized and better overall.      .. Active Ambulatory Problems    Diagnosis Date Noted   Hypothyroidism 11/10/2010   Hyperlipidemia 10/22/2011   Essential hypertension 10/22/2011   PCOS (polycystic ovarian syndrome) 10/22/2011   Severe obesity (BMI >= 40) (HCC) 10/24/2012   Obesity, Class III, BMI 40-49.9 (morbid obesity) (HCC) 08/21/2013   Onychomycosis 08/21/2013   Class 3 severe obesity due to excess calories with serious comorbidity and body mass index (BMI) of 45.0 to 49.9 in adult (HCC) 10/16/2014   Thyroid activity decreased 10/16/2014   Abnormal weight gain 10/16/2014   Rash and nonspecific skin eruption 07/15/2015   Pre-diabetes 12/18/2015   Elevated liver enzymes 12/18/2015   Right ankle pain 12/29/2015   Gallstones 12/30/2015   Large breasts 02/17/2016   Morbid obesity (HCC) 05/18/2016   Hematuria 02/22/2017   Metabolic syndrome 12/21/2017   B12 deficiency 04/23/2019   Dyslipidemia 04/29/2019   Type 2 diabetes mellitus with hyperglycemia, without long-term current use of insulin (HCC) 08/02/2019   Anxiety 08/02/2019   Epigastric abdominal pain 03/25/2020   Nausea 03/25/2020   Resolved Ambulatory Problems    Diagnosis Date Noted   Hypothyroidism 11/04/2011   Depression 01/13/2016   Past Medical History:  Diagnosis Date   Hypertension    Thyroid disease      Review of Systems See HPI.     Objective:   Physical Exam Vitals reviewed.   Constitutional:      Appearance: Normal appearance. She is obese.  HENT:     Head: Normocephalic.  Cardiovascular:     Rate and Rhythm: Normal rate and regular rhythm.     Pulses: Normal pulses.     Heart sounds: Normal heart sounds.  Pulmonary:     Effort: Pulmonary effort is normal.     Breath sounds: Normal breath sounds.  Musculoskeletal:     Right lower leg: No edema.     Left lower leg: No edema.  Neurological:     General: No focal deficit present.     Mental Status: She is alert and oriented to person, place, and time.  Psychiatric:        Mood and Affect: Mood normal.      .. Results for orders placed or performed in visit on 03/17/21  TSH  Result Value Ref Range   TSH 0.67 mIU/L  POCT glycosylated hemoglobin (Hb A1C)  Result Value Ref Range   Hemoglobin A1C 6.7 (A) 4.0 - 5.6 %   HbA1c POC (<> result, manual entry)     HbA1c, POC (prediabetic range)     HbA1c, POC (controlled diabetic range)         Assessment & Plan:  Marland KitchenMarland KitchenBonnita was seen today for diabetes.  Diagnoses and all orders for this visit:  Type 2 diabetes mellitus with hyperglycemia, without long-term current use of insulin (HCC) -     POCT glycosylated hemoglobin (Hb A1C) -  Semaglutide, 1 MG/DOSE, 4 MG/3ML SOPN; Inject 1 mg as directed once a week.  Need for influenza vaccination -     Flu Vaccine QUAD 80mo+IM (Fluarix, Fluzone & Alfiuria Quad PF)  Acquired hypothyroidism -     TSH -     levothyroxine (SYNTHROID) 125 MCG tablet; Take 1 tablet (125 mcg total) by mouth daily.  Class 3 severe obesity due to excess calories with serious comorbidity and body mass index (BMI) of 45.0 to 49.9 in adult [facility])  Essential hypertension -     hydrochlorothiazide (HYDRODIURIL) 25 MG tablet; Take 1 tablet (25 mg total) by mouth daily.  Anxiety -     buPROPion (WELLBUTRIN XL) 150 MG 24 hr tablet; Take 1 tablet (150 mg total) by mouth daily.   Recheck TSH and refill accordingly.   A1C under 7 to  goal.  Increased ozempic to .5mg  weekly to get better A1C and weight lowering coverage.  Continue to work on weight loss.  BP to goal. Not on ace.  On statin.  Eye and foot exam UTD.  Declined covid vaccine.  Flu shot given today.  Pneumonia UTD.   Follow up in 3 months.

## 2021-03-18 LAB — TSH: TSH: 0.67 mIU/L

## 2021-03-18 NOTE — Progress Notes (Signed)
In normal range. Stay at same dose. Recheck in 6 months.

## 2021-03-22 ENCOUNTER — Encounter: Payer: Self-pay | Admitting: Physician Assistant

## 2021-03-22 MED ORDER — HYDROCHLOROTHIAZIDE 25 MG PO TABS: 25.0000 mg | ORAL_TABLET | Freq: Every day | ORAL | 1 refills | Status: DC

## 2021-03-22 MED ORDER — BUPROPION HCL ER (XL) 150 MG PO TB24: 150.0000 mg | ORAL_TABLET | Freq: Every day | ORAL | 1 refills | Status: DC

## 2021-03-22 MED ORDER — LEVOTHYROXINE SODIUM 125 MCG PO TABS: 125.0000 ug | ORAL_TABLET | Freq: Every day | ORAL | 1 refills | Status: DC

## 2021-06-16 ENCOUNTER — Ambulatory Visit: Payer: BC Managed Care – PPO | Admitting: Physician Assistant

## 2021-06-30 ENCOUNTER — Telehealth: Payer: Self-pay | Admitting: *Deleted

## 2021-06-30 NOTE — Telephone Encounter (Signed)
Voicemail left with my contact information and information about core research. Encouraged Kelly Reeves to call back.

## 2021-07-27 ENCOUNTER — Encounter: Payer: Self-pay | Admitting: *Deleted

## 2021-07-27 DIAGNOSIS — Z006 Encounter for examination for normal comparison and control in clinical research program: Secondary | ICD-10-CM

## 2021-07-27 NOTE — Patient Instructions (Signed)
Message left to follow up to see if Kelly Reeves had any questions about the core consent that was emailed to her.

## 2021-08-04 LAB — HM DIABETES EYE EXAM

## 2021-08-05 ENCOUNTER — Encounter: Payer: Self-pay | Admitting: Neurology

## 2021-09-01 ENCOUNTER — Other Ambulatory Visit: Payer: Self-pay

## 2021-09-01 ENCOUNTER — Ambulatory Visit (INDEPENDENT_AMBULATORY_CARE_PROVIDER_SITE_OTHER): Payer: BC Managed Care – PPO | Admitting: Physician Assistant

## 2021-09-01 ENCOUNTER — Encounter: Payer: Self-pay | Admitting: Physician Assistant

## 2021-09-01 VITALS — BP 140/72 | HR 104 | Temp 98.5°F | Ht 64.0 in | Wt 252.0 lb

## 2021-09-01 DIAGNOSIS — E1165 Type 2 diabetes mellitus with hyperglycemia: Secondary | ICD-10-CM | POA: Diagnosis not present

## 2021-09-01 DIAGNOSIS — E039 Hypothyroidism, unspecified: Secondary | ICD-10-CM | POA: Diagnosis not present

## 2021-09-01 DIAGNOSIS — J329 Chronic sinusitis, unspecified: Secondary | ICD-10-CM

## 2021-09-01 DIAGNOSIS — I10 Essential (primary) hypertension: Secondary | ICD-10-CM

## 2021-09-01 DIAGNOSIS — E66813 Obesity, class 3: Secondary | ICD-10-CM

## 2021-09-01 DIAGNOSIS — J40 Bronchitis, not specified as acute or chronic: Secondary | ICD-10-CM

## 2021-09-01 DIAGNOSIS — Z6841 Body Mass Index (BMI) 40.0 and over, adult: Secondary | ICD-10-CM

## 2021-09-01 LAB — POCT GLYCOSYLATED HEMOGLOBIN (HGB A1C): Hemoglobin A1C: 6.3 % — AB (ref 4.0–5.6)

## 2021-09-01 MED ORDER — HYDROCHLOROTHIAZIDE 25 MG PO TABS: 25.0000 mg | ORAL_TABLET | Freq: Every day | ORAL | 1 refills | Status: DC

## 2021-09-01 MED ORDER — DOXYCYCLINE HYCLATE 100 MG PO TABS: 100.0000 mg | ORAL_TABLET | Freq: Two times a day (BID) | ORAL | 0 refills | Status: DC

## 2021-09-01 MED ORDER — ALBUTEROL SULFATE HFA 108 (90 BASE) MCG/ACT IN AERS: 2.0000 | INHALATION_SPRAY | Freq: Four times a day (QID) | RESPIRATORY_TRACT | 0 refills | Status: DC | PRN

## 2021-09-01 MED ORDER — LEVOTHYROXINE SODIUM 125 MCG PO TABS: 125.0000 ug | ORAL_TABLET | Freq: Every day | ORAL | 1 refills | Status: DC

## 2021-09-01 MED ORDER — BENZONATATE 200 MG PO CAPS: 200.0000 mg | ORAL_CAPSULE | Freq: Three times a day (TID) | ORAL | 1 refills | Status: DC | PRN

## 2021-09-01 MED ORDER — METHYLPREDNISOLONE 4 MG PO TBPK: ORAL_TABLET | ORAL | 0 refills | Status: DC

## 2021-09-01 NOTE — Progress Notes (Signed)
? ?Subjective:  ? ? Patient ID: Kelly Reeves, female    DOB: Mar 02, 1966, 56 y.o.   MRN: [id number] ? ?HPI ?Pt is a 56 yo female with T2DM, hypothyroidism, HLD, HTN, PCOS who presents to the clinic for medication refills.  ? ?She is doing well. She is not checking her sugars. She has not been taking ozempic every week. No open sores or wounds. No hypoglycemic events. She is watching sugars and carbs.  ? ?She has been congested with sinus pressure and drainage for a few weeks. OtC meds helped but conitnues to have symptoms. No fever, chills, SOB, body aches.  ? ?.. ?Active Ambulatory Problems  ?  Diagnosis Date Noted  ? Hypothyroidism 11/10/2010  ? Hyperlipidemia 10/22/2011  ? Essential hypertension 10/22/2011  ? PCOS (polycystic ovarian syndrome) 10/22/2011  ? Severe obesity (BMI >= 40) (HCC) 10/24/2012  ? Obesity, Class III, BMI 40-49.9 (morbid obesity) (HCC) 08/21/2013  ? Onychomycosis 08/21/2013  ? Class 3 severe obesity due to excess calories with serious comorbidity and body mass index (BMI) of 40.0 to 44.9 in adult [facility]) 10/16/2014  ? Thyroid activity decreased 10/16/2014  ? Abnormal weight gain 10/16/2014  ? Rash and nonspecific skin eruption 07/15/2015  ? Pre-diabetes 12/18/2015  ? Elevated liver enzymes 12/18/2015  ? Right ankle pain 12/29/2015  ? Gallstones 12/30/2015  ? Large breasts 02/17/2016  ? Morbid obesity (HCC) 05/18/2016  ? Hematuria 02/22/2017  ? Metabolic syndrome 12/21/2017  ? B12 deficiency 04/23/2019  ? Dyslipidemia 04/29/2019  ? Type 2 diabetes mellitus with hyperglycemia, without long-term current use of insulin (HCC) 08/02/2019  ? Anxiety 08/02/2019  ? Epigastric abdominal pain 03/25/2020  ? Nausea 03/25/2020  ? ?Resolved Ambulatory Problems  ?  Diagnosis Date Noted  ? Hypothyroidism 11/04/2011  ? Depression 01/13/2016  ? ?Past Medical History:  ?Diagnosis Date  ? Hypertension   ? Thyroid disease   ? ? ? ?Review of Systems  ?All other systems reviewed and are negative. ? ?   ?Objective:   ? Physical Exam ?Vitals reviewed.  ?Constitutional:   ?   Appearance: Normal appearance. She is obese.  ?HENT:  ?   Head: Normocephalic.  ?   Right Ear: Tympanic membrane, ear canal and external ear normal. There is no impacted cerumen.  ?   Left Ear: Tympanic membrane, ear canal and external ear normal. There is no impacted cerumen.  ?   Nose: Congestion present.  ?   Mouth/Throat:  ?   Pharynx: Posterior oropharyngeal erythema present.  ?Eyes:  ?   General:     ?   Right eye: No discharge.     ?   Left eye: No discharge.  ?   Conjunctiva/sclera: Conjunctivae normal.  ?Neck:  ?   Vascular: No carotid bruit.  ?Cardiovascular:  ?   Rate and Rhythm: Normal rate and regular rhythm.  ?   Pulses: Normal pulses.  ?Pulmonary:  ?   Effort: Pulmonary effort is normal.  ?   Breath sounds: Normal breath sounds.  ?Musculoskeletal:  ?   Right lower leg: No edema.  ?   Left lower leg: No edema.  ?Lymphadenopathy:  ?   Cervical: Cervical adenopathy present.  ?Neurological:  ?   General: No focal deficit present.  ?   Mental Status: She is alert.  ?Psychiatric:     ?   Mood and Affect: Mood normal.  ? ? ? ? ?.. ?Results for orders placed or performed in visit on 09/01/21  ?POCT  glycosylated hemoglobin (Hb A1C)  ?Result Value Ref Range  ? Hemoglobin A1C 6.3 (A) 4.0 - 5.6 %  ? HbA1c POC (<> result, manual entry)    ? HbA1c, POC (prediabetic range)    ? HbA1c, POC (controlled diabetic range)    ? ? ?   ?Assessment & Plan:  ?..Kelly Reeves was seen today for diabetes and follow-up. ? ?Diagnoses and all orders for this visit: ? ?Type 2 diabetes mellitus with hyperglycemia, without long-term current use of insulin (HCC) ?-     POCT glycosylated hemoglobin (Hb A1C) ?-     CBC with Differential/Platelet ? ?Acquired hypothyroidism ?-     TSH ?-     levothyroxine (SYNTHROID) 125 MCG tablet; Take 1 tablet (125 mcg total) by mouth daily. ?-     CBC with Differential/Platelet ? ?Essential hypertension ?-     COMPLETE METABOLIC PANEL WITH GFR ?-      hydrochlorothiazide (HYDRODIURIL) 25 MG tablet; Take 1 tablet (25 mg total) by mouth daily. ?-     CBC with Differential/Platelet ? ?Class 3 severe obesity due to excess calories with serious comorbidity and body mass index (BMI) of 40.0 to 44.9 in adult [facility]) ? ?Sinobronchitis ?-     methylPREDNISolone (MEDROL DOSEPAK) 4 MG TBPK tablet; Take as directed by package insert. ?-     doxycycline (VIBRA-TABS) 100 MG tablet; Take 1 tablet (100 mg total) by mouth 2 (two) times daily. ?-     benzonatate (TESSALON) 200 MG capsule; Take 1 capsule (200 mg total) by mouth 3 (three) times daily as needed. ? ?Other orders ?-     albuterol (VENTOLIN HFA) 108 (90 Base) MCG/ACT inhaler; Inhale 2 puffs into the lungs every 6 (six) hours as needed. ? ? ?A1C to goal. ?Continue ozempic ?BP not to goal but patient is sick today ?On livalo ?Eye and foot exam UTD.  ?Declined shingles, covid, flu. ?Follow up in 3 months.  ? ?Treated sinobronchitis with doxy, tessalon, medrol dose pack. Follow up as needed.  ? ?

## 2021-09-20 ENCOUNTER — Ambulatory Visit (INDEPENDENT_AMBULATORY_CARE_PROVIDER_SITE_OTHER): Payer: BC Managed Care – PPO | Admitting: Physician Assistant

## 2021-09-20 ENCOUNTER — Encounter: Payer: Self-pay | Admitting: Physician Assistant

## 2021-09-20 ENCOUNTER — Other Ambulatory Visit: Payer: Self-pay

## 2021-09-20 ENCOUNTER — Ambulatory Visit (INDEPENDENT_AMBULATORY_CARE_PROVIDER_SITE_OTHER): Payer: BC Managed Care – PPO

## 2021-09-20 VITALS — BP 138/80 | HR 97 | Temp 98.7°F | Ht 64.0 in | Wt 252.0 lb

## 2021-09-20 DIAGNOSIS — Z79899 Other long term (current) drug therapy: Secondary | ICD-10-CM

## 2021-09-20 DIAGNOSIS — E039 Hypothyroidism, unspecified: Secondary | ICD-10-CM

## 2021-09-20 DIAGNOSIS — E041 Nontoxic single thyroid nodule: Secondary | ICD-10-CM | POA: Diagnosis not present

## 2021-09-20 DIAGNOSIS — R590 Localized enlarged lymph nodes: Secondary | ICD-10-CM | POA: Insufficient documentation

## 2021-09-20 DIAGNOSIS — R030 Elevated blood-pressure reading, without diagnosis of hypertension: Secondary | ICD-10-CM

## 2021-09-20 LAB — POCT RAPID STREP A (OFFICE): Rapid Strep A Screen: NEGATIVE

## 2021-09-20 NOTE — Progress Notes (Signed)
? ?Subjective:  ? ? Patient ID: Kelly Reeves, female    DOB: 10/31/1965, 56 y.o.   MRN: [id number] ? ?HPI ?Pt is a 56 yo obese female with T2DM, hypothyroidism, HTN, HLD who presents to the clinic to follow up on lymph node enlargement of neck since November. She recently completed doxy, medrol dose pack and did not make any difference. She noticed her lymph nodes after illness in November. No fever, chills, sinus pressure, cough, headache, ear pain. She feels great. No changes to medication. No problems swallowing. She is not just concerned because it has been going on so long.  ? ?.. ?Active Ambulatory Problems  ?  Diagnosis Date Noted  ? Hypothyroidism 11/10/2010  ? Hyperlipidemia 10/22/2011  ? Essential hypertension 10/22/2011  ? PCOS (polycystic ovarian syndrome) 10/22/2011  ? Severe obesity (BMI >= 40) (HCC) 10/24/2012  ? Obesity, Class III, BMI 40-49.9 (morbid obesity) (HCC) 08/21/2013  ? Onychomycosis 08/21/2013  ? Class 3 severe obesity due to excess calories with serious comorbidity and body mass index (BMI) of 40.0 to 44.9 in adult [facility]) 10/16/2014  ? Thyroid activity decreased 10/16/2014  ? Abnormal weight gain 10/16/2014  ? Rash and nonspecific skin eruption 07/15/2015  ? Pre-diabetes 12/18/2015  ? Elevated liver enzymes 12/18/2015  ? Right ankle pain 12/29/2015  ? Gallstones 12/30/2015  ? Large breasts 02/17/2016  ? Morbid obesity (HCC) 05/18/2016  ? Hematuria 02/22/2017  ? Metabolic syndrome 12/21/2017  ? B12 deficiency 04/23/2019  ? Dyslipidemia 04/29/2019  ? Type 2 diabetes mellitus with hyperglycemia, without long-term current use of insulin (HCC) 08/02/2019  ? Anxiety 08/02/2019  ? Epigastric abdominal pain 03/25/2020  ? Nausea 03/25/2020  ? Anterior cervical adenopathy 09/20/2021  ? Elevated blood pressure reading 09/20/2021  ? ?Resolved Ambulatory Problems  ?  Diagnosis Date Noted  ? Hypothyroidism 11/04/2011  ? Depression 01/13/2016  ? ?Past Medical History:  ?Diagnosis Date  ? Hypertension    ? Thyroid disease   ? ? ? ? ? ?Review of Systems ?See HPI.  ?   ?Objective:  ? Physical Exam ?Vitals reviewed.  ?Constitutional:   ?   Appearance: Normal appearance. She is obese.  ?HENT:  ?   Head: Normocephalic.  ?   Right Ear: Tympanic membrane, ear canal and external ear normal. There is no impacted cerumen.  ?   Left Ear: Tympanic membrane, ear canal and external ear normal. There is no impacted cerumen.  ?   Nose: Nose normal. No congestion or rhinorrhea.  ?   Mouth/Throat:  ?   Mouth: Mucous membranes are moist.  ?   Pharynx: No oropharyngeal exudate or posterior oropharyngeal erythema.  ?Eyes:  ?   Extraocular Movements: Extraocular movements intact.  ?   Conjunctiva/sclera: Conjunctivae normal.  ?   Pupils: Pupils are equal, round, and reactive to light.  ?Neck:  ?   Vascular: No carotid bruit.  ?   Comments: Bilateral symmetric enlarged and slightly firm anterior cervical lymphnodes, non-tender.  ?Cardiovascular:  ?   Rate and Rhythm: Normal rate.  ?   Pulses: Normal pulses.  ?Pulmonary:  ?   Effort: Pulmonary effort is normal.  ?   Breath sounds: Normal breath sounds.  ?Abdominal:  ?   General: Bowel sounds are normal.  ?   Palpations: Abdomen is soft.  ?Musculoskeletal:  ?   Cervical back: Neck supple. No rigidity or tenderness.  ?Lymphadenopathy:  ?   Cervical: Cervical adenopathy present.  ?Neurological:  ?   General: No  focal deficit present.  ?   Mental Status: She is alert.  ?Psychiatric:     ?   Mood and Affect: Mood normal.  ? ? ? ? ? ?   ?Assessment & Plan:  ?..Kelly Reeves was seen today for sore throat. ? ?Diagnoses and all orders for this visit: ? ?Anterior cervical adenopathy ?-     POCT rapid strep A ?-     CBC with Differential/Platelet ?-     US SOFT TISSUE HEAD & NECK (NON-THYROID); Future ? ?Medication management ?-     COMPLETE METABOLIC PANEL WITH GFR ?-     TSH ? ?Acquired hypothyroidism ?-     TSH ?-     levothyroxine (SYNTHROID) 125 MCG tablet; Take 1 tablet (125 mcg total) by mouth  daily. ? ?Elevated blood pressure reading ? ? ?Get [place] of neck ?PE unremarkable except for bilateral lymph node enlargement ?Recheck cbc ?Recheck TSH for medication refills ?2nd recheck of BP much better ?

## 2021-09-20 NOTE — Patient Instructions (Signed)
Get ultrasound of neck. ?Get labs. ? ?

## 2021-09-21 LAB — CBC WITH DIFFERENTIAL/PLATELET
Absolute Monocytes: 577 cells/uL (ref 200–950)
Basophils Absolute: 59 cells/uL (ref 0–200)
Basophils Relative: 0.8 %
Eosinophils Absolute: 163 cells/uL (ref 15–500)
Eosinophils Relative: 2.2 %
HCT: 44.9 % (ref 35.0–45.0)
Hemoglobin: 15 g/dL (ref 11.7–15.5)
Lymphs Abs: 2235 cells/uL (ref 850–3900)
MCH: 30.3 pg (ref 27.0–33.0)
MCHC: 33.4 g/dL (ref 32.0–36.0)
MCV: 90.7 fL (ref 80.0–100.0)
MPV: 10.2 fL (ref 7.5–12.5)
Monocytes Relative: 7.8 %
Neutro Abs: 4366 cells/uL (ref 1500–7800)
Neutrophils Relative %: 59 %
Platelets: 395 10*3/uL (ref 140–400)
RBC: 4.95 10*6/uL (ref 3.80–5.10)
RDW: 13.9 % (ref 11.0–15.0)
Total Lymphocyte: 30.2 %
WBC: 7.4 10*3/uL (ref 3.8–10.8)

## 2021-09-21 LAB — COMPLETE METABOLIC PANEL WITH GFR
AG Ratio: 1.4 (calc) (ref 1.0–2.5)
ALT: 57 U/L — ABNORMAL HIGH (ref 6–29)
AST: 88 U/L — ABNORMAL HIGH (ref 10–35)
Albumin: 4.3 g/dL (ref 3.6–5.1)
Alkaline phosphatase (APISO): 86 U/L (ref 37–153)
BUN: 8 mg/dL (ref 7–25)
CO2: 24 mmol/L (ref 20–32)
Calcium: 10.1 mg/dL (ref 8.6–10.4)
Chloride: 98 mmol/L (ref 98–110)
Creat: 0.74 mg/dL (ref 0.50–1.03)
Globulin: 3.1 g/dL (calc) (ref 1.9–3.7)
Glucose, Bld: 102 mg/dL — ABNORMAL HIGH (ref 65–99)
Potassium: 3.8 mmol/L (ref 3.5–5.3)
Sodium: 140 mmol/L (ref 135–146)
Total Bilirubin: 0.6 mg/dL (ref 0.2–1.2)
Total Protein: 7.4 g/dL (ref 6.1–8.1)
eGFR: 95 mL/min/{1.73_m2} (ref >=60)

## 2021-09-21 LAB — TSH: TSH: 0.89 mIU/L

## 2021-09-21 MED ORDER — LEVOTHYROXINE SODIUM 125 MCG PO TABS: 125.0000 ug | ORAL_TABLET | Freq: Every day | ORAL | 3 refills | Status: DC

## 2021-09-21 NOTE — Progress Notes (Signed)
1.0cm thyroid nodule but not clinically significant to need biopsy.  ? ?Your lymph nodes are a little enlarged but not really by imagining criteria. GREAT news!  ? ?

## 2021-09-21 NOTE — Progress Notes (Signed)
Thyroid looks great. Stay at same dose.  ?WBC looks great.  ?Hemoglobin looks good.  ?No abnormalities found.

## 2021-09-21 NOTE — Progress Notes (Signed)
Kidney looks great.  ? ?Liver enzymes up some recheck in 3 months.

## 2021-09-23 ENCOUNTER — Other Ambulatory Visit: Payer: Self-pay | Admitting: Physician Assistant

## 2021-10-06 ENCOUNTER — Other Ambulatory Visit: Payer: Self-pay | Admitting: Obstetrics and Gynecology

## 2021-10-06 DIAGNOSIS — Z1231 Encounter for screening mammogram for malignant neoplasm of breast: Secondary | ICD-10-CM

## 2021-10-13 ENCOUNTER — Ambulatory Visit
Admission: RE | Admit: 2021-10-13 | Discharge: 2021-10-13 | Disposition: A | Discharge status: Home / Self Care | Payer: BC Managed Care – PPO | Source: Ambulatory Visit | Attending: Obstetrics and Gynecology | Admitting: Obstetrics and Gynecology

## 2021-10-13 DIAGNOSIS — Z1231 Encounter for screening mammogram for malignant neoplasm of breast: Secondary | ICD-10-CM | POA: Diagnosis not present

## 2021-10-14 ENCOUNTER — Other Ambulatory Visit: Payer: Self-pay | Admitting: Physician Assistant

## 2021-10-14 DIAGNOSIS — F419 Anxiety disorder, unspecified: Secondary | ICD-10-CM

## 2021-12-08 ENCOUNTER — Ambulatory Visit: Payer: BC Managed Care – PPO | Admitting: Physician Assistant

## 2021-12-15 ENCOUNTER — Encounter: Payer: Self-pay | Admitting: Physician Assistant

## 2021-12-15 DIAGNOSIS — Z01419 Encounter for gynecological examination (general) (routine) without abnormal findings: Secondary | ICD-10-CM | POA: Diagnosis not present

## 2021-12-15 DIAGNOSIS — Z6841 Body Mass Index (BMI) 40.0 and over, adult: Secondary | ICD-10-CM | POA: Diagnosis not present

## 2021-12-15 DIAGNOSIS — N926 Irregular menstruation, unspecified: Secondary | ICD-10-CM | POA: Diagnosis not present

## 2021-12-15 MED ORDER — OZEMPIC (0.25 OR 0.5 MG/DOSE) 2 MG/3ML ~~LOC~~ SOPN: 0.5000 mg | PEN_INJECTOR | SUBCUTANEOUS | 1 refills | Status: DC

## 2021-12-15 MED ORDER — OZEMPIC (0.25 OR 0.5 MG/DOSE) 2 MG/3ML ~~LOC~~ SOPN: 0.2500 mg | PEN_INJECTOR | SUBCUTANEOUS | 0 refills | Status: DC

## 2021-12-16 ENCOUNTER — Telehealth: Payer: Self-pay

## 2021-12-16 NOTE — Telephone Encounter (Addendum)
Initiated Prior authorization [id number] (0.25 or 0.5 MG/DOSE) 2MG /3ML pen-injectors Via: Covermymeds Case/Key:[id number] Status: approved  as of 12/16/21 Reason:Start Date:11/16/2021;Coverage End Date:12/16/2022 Notified Pt via: Mychart

## 2021-12-26 ENCOUNTER — Other Ambulatory Visit: Payer: Self-pay | Admitting: Physician Assistant

## 2021-12-26 DIAGNOSIS — E782 Mixed hyperlipidemia: Secondary | ICD-10-CM

## 2021-12-29 DIAGNOSIS — N926 Irregular menstruation, unspecified: Secondary | ICD-10-CM | POA: Diagnosis not present

## 2021-12-29 DIAGNOSIS — N950 Postmenopausal bleeding: Secondary | ICD-10-CM | POA: Diagnosis not present

## 2022-01-12 ENCOUNTER — Ambulatory Visit (INDEPENDENT_AMBULATORY_CARE_PROVIDER_SITE_OTHER): Payer: BC Managed Care – PPO | Admitting: Physician Assistant

## 2022-01-12 ENCOUNTER — Encounter: Payer: Self-pay | Admitting: Physician Assistant

## 2022-01-12 VITALS — BP 126/82 | HR 85 | Ht 64.0 in | Wt 251.0 lb

## 2022-01-12 DIAGNOSIS — E1165 Type 2 diabetes mellitus with hyperglycemia: Secondary | ICD-10-CM | POA: Diagnosis not present

## 2022-01-12 DIAGNOSIS — Z23 Encounter for immunization: Secondary | ICD-10-CM

## 2022-01-12 DIAGNOSIS — Z6841 Body Mass Index (BMI) 40.0 and over, adult: Secondary | ICD-10-CM | POA: Diagnosis not present

## 2022-01-12 LAB — POCT GLYCOSYLATED HEMOGLOBIN (HGB A1C): HbA1c, POC (controlled diabetic range): 6.2 % (ref 0.0–7.0)

## 2022-01-12 MED ORDER — OZEMPIC (0.25 OR 0.5 MG/DOSE) 2 MG/3ML ~~LOC~~ SOPN: 0.5000 mg | PEN_INJECTOR | SUBCUTANEOUS | 0 refills | Status: DC

## 2022-01-12 MED ORDER — SEMAGLUTIDE (1 MG/DOSE) 4 MG/3ML ~~LOC~~ SOPN: 1.0000 mg | PEN_INJECTOR | SUBCUTANEOUS | 0 refills | Status: DC

## 2022-01-12 NOTE — Progress Notes (Signed)
Established Patient Office Visit  Subjective   Patient ID: Kelly Reeves, female    DOB: 04/12/1966  Age: 56 y.o. MRN: [id number]  Chief Complaint  Patient presents with   Follow-up   Diabetes    HPI Pt is a 56 yo obese female with T2DM, HTN, HLD, hypothyroidism, PCOS who presents to the clinic for 3 month follow up.   Pt did finally start ozempic. She is tolerating well. She is not checking her sugars. She has lost 7lbs according to her scales. No CP, palpitations, headaches or vision changes.  She is not checking her sugars. She just started .5mg  dose. She is keeping to about 1200 calories a day and trying to stay active and walk most days.   Her mood is good.   Active Ambulatory Problems    Diagnosis Date Noted   Hypothyroidism 11/10/2010   Hyperlipidemia 10/22/2011   Essential hypertension 10/22/2011   PCOS (polycystic ovarian syndrome) 10/22/2011   Severe obesity (BMI >= 40) (HCC) 10/24/2012   Obesity, Class III, BMI 40-49.9 (morbid obesity) (HCC) 08/21/2013   Onychomycosis 08/21/2013   Class 3 severe obesity due to excess calories with serious comorbidity and body mass index (BMI) of 40.0 to 44.9 in adult (HCC) 10/16/2014   Thyroid activity decreased 10/16/2014   Abnormal weight gain 10/16/2014   Rash and nonspecific skin eruption 07/15/2015   Pre-diabetes 12/18/2015   Elevated liver enzymes 12/18/2015   Right ankle pain 12/29/2015   Gallstones 12/30/2015   Large breasts 02/17/2016   Morbid obesity (HCC) 05/18/2016   Hematuria 02/22/2017   Metabolic syndrome 12/21/2017   B12 deficiency 04/23/2019   Dyslipidemia 04/29/2019   Type 2 diabetes mellitus with hyperglycemia, without long-term current use of insulin (HCC) 08/02/2019   Anxiety 08/02/2019   Epigastric abdominal pain 03/25/2020   Nausea 03/25/2020   Anterior cervical adenopathy 09/20/2021   Elevated blood pressure reading 09/20/2021   Resolved Ambulatory Problems    Diagnosis Date Noted    Hypothyroidism 11/04/2011   Depression 01/13/2016   Past Medical History:  Diagnosis Date   Hypertension    Thyroid disease      Review of Systems  All other systems reviewed and are negative.     Objective:     BP 126/82   Pulse 85   Ht 5\' 4"  (1.626 m)   Wt 251 lb (113.9 kg)   SpO2 96%   BMI 43.08 kg/m  BP Readings from Last 3 Encounters:  01/12/22 126/82  09/20/21 138/80  09/01/21 140/72   Wt Readings from Last 3 Encounters:  01/12/22 251 lb (113.9 kg)  09/20/21 252 lb (114.3 kg)  09/01/21 252 lb (114.3 kg)    .09/22/21 Lab Results  Component Value Date   HGBA1C 6.2 01/12/2022     Physical Exam Constitutional:      Appearance: Normal appearance. She is obese.  HENT:     Head: Normocephalic.  Neck:     Vascular: No carotid bruit.  Cardiovascular:     Rate and Rhythm: Normal rate and regular rhythm.     Pulses: Normal pulses.     Heart sounds: Normal heart sounds.  Pulmonary:     Effort: Pulmonary effort is normal.     Breath sounds: Normal breath sounds.  Musculoskeletal:     Right lower leg: No edema.     Left lower leg: No edema.  Neurological:     General: No focal deficit present.     Mental Status: She is alert  and oriented to person, place, and time.  Psychiatric:        Mood and Affect: Mood normal.         Assessment & Plan:  Marland KitchenMarland KitchenJulietta was seen today for follow-up and diabetes.  Diagnoses and all orders for this visit:  Type 2 diabetes mellitus with hyperglycemia, without long-term current use of insulin (HCC) -     POCT glycosylated hemoglobin (Hb A1C) -     Semaglutide, 1 MG/DOSE, 4 MG/3ML SOPN; Inject 1 mg as directed once a week. -     Semaglutide,0.25 or 0.5MG /DOS, (OZEMPIC, 0.25 OR 0.5 MG/DOSE,) 2 MG/3ML SOPN; Inject 0.5 mg into the skin once a week.  Need for Tdap vaccination -     Tdap vaccine greater than or equal to 7yo IM  Need for shingles vaccine -     Varicella-zoster vaccine IM  Class 3 severe obesity due to excess  calories with serious comorbidity and body mass index (BMI) of 40.0 to 44.9 in adult (HCC)   A1C to goal Continue on ozempic increase to 1mg  for weight benefits Continue 150 minutes of exercise a week BP to goal On statin Eye and foot exam UTD Pneumonia UTD Shingles/Tdap given today. Follow up in 3 months  Return in about 3 months (around 04/14/2022).    06/14/2022, PA-C

## 2022-01-19 ENCOUNTER — Encounter: Payer: Self-pay | Admitting: Physician Assistant

## 2022-01-19 MED ORDER — SPIRONOLACTONE 25 MG PO TABS: 25.0000 mg | ORAL_TABLET | Freq: Every day | ORAL | 3 refills | Status: DC

## 2022-01-26 DIAGNOSIS — N926 Irregular menstruation, unspecified: Secondary | ICD-10-CM | POA: Diagnosis not present

## 2022-03-12 ENCOUNTER — Other Ambulatory Visit: Payer: Self-pay | Admitting: Physician Assistant

## 2022-03-12 DIAGNOSIS — I10 Essential (primary) hypertension: Secondary | ICD-10-CM

## 2022-04-17 ENCOUNTER — Other Ambulatory Visit: Payer: Self-pay | Admitting: Physician Assistant

## 2022-04-17 DIAGNOSIS — F419 Anxiety disorder, unspecified: Secondary | ICD-10-CM

## 2022-04-20 ENCOUNTER — Ambulatory Visit (INDEPENDENT_AMBULATORY_CARE_PROVIDER_SITE_OTHER): Payer: BC Managed Care – PPO | Admitting: Physician Assistant

## 2022-04-20 VITALS — BP 137/74 | HR 89 | Ht 64.0 in | Wt 245.0 lb

## 2022-04-20 DIAGNOSIS — Z23 Encounter for immunization: Secondary | ICD-10-CM | POA: Diagnosis not present

## 2022-04-20 DIAGNOSIS — R809 Proteinuria, unspecified: Secondary | ICD-10-CM

## 2022-04-20 DIAGNOSIS — E782 Mixed hyperlipidemia: Secondary | ICD-10-CM

## 2022-04-20 DIAGNOSIS — E1165 Type 2 diabetes mellitus with hyperglycemia: Secondary | ICD-10-CM

## 2022-04-20 DIAGNOSIS — E039 Hypothyroidism, unspecified: Secondary | ICD-10-CM

## 2022-04-20 DIAGNOSIS — F419 Anxiety disorder, unspecified: Secondary | ICD-10-CM | POA: Diagnosis not present

## 2022-04-20 LAB — POCT UA - MICROALBUMIN
Creatinine, POC: 100 mg/dL
Microalbumin Ur, POC: 80 mg/L

## 2022-04-20 LAB — POCT GLYCOSYLATED HEMOGLOBIN (HGB A1C): Hemoglobin A1C: 5.6 % (ref 4.0–5.6)

## 2022-04-20 MED ORDER — SEMAGLUTIDE (1 MG/DOSE) 4 MG/3ML ~~LOC~~ SOPN: 1.0000 mg | PEN_INJECTOR | SUBCUTANEOUS | 0 refills | Status: DC

## 2022-04-20 NOTE — Progress Notes (Signed)
Established Patient Office Visit  Subjective   Patient ID: Kelly Reeves, female    DOB: [birth year]  Age: 56 y.o. MRN: [id number]  Chief Complaint  Patient presents with   Follow-up   Diabetes    HPI Pt is a 56 yo obese female with T2DM, HTN, PCOS, HLd who presents to the clinic for 3 month follow up.   Pt is doing well. She is not checking her sugars. She is eating better and walking more. She has lost 8lbs.denies any hypoglycemic events. No open sores or wounds. She is tolerating medication very well.    .. Active Ambulatory Problems    Diagnosis Date Noted   Hypothyroidism 11/10/2010   Hyperlipidemia 10/22/2011   Essential hypertension 10/22/2011   PCOS (polycystic ovarian syndrome) 10/22/2011   Severe obesity (BMI >= 40) (HCC) 10/24/2012   Obesity, Class III, BMI 40-49.9 (morbid obesity) ([place]) 08/21/2013   Onychomycosis 08/21/2013   Class 3 severe obesity due to excess calories with serious comorbidity and body mass index (BMI) of 40.0 to 44.9 in adult ([place]) 10/16/2014   Thyroid activity decreased 10/16/2014   Abnormal weight gain 10/16/2014   Rash and nonspecific skin eruption 07/15/2015   Pre-diabetes 12/18/2015   Elevated liver enzymes 12/18/2015   Right ankle pain 12/29/2015   Gallstones 12/30/2015   Large breasts 02/17/2016   Morbid obesity ([place]) 05/18/2016   Hematuria 123456   Metabolic syndrome [id number]   B12 deficiency 04/23/2019   Dyslipidemia 04/29/2019   Type 2 diabetes mellitus with hyperglycemia, without long-term current use of insulin ([place]) 08/02/2019   Anxiety 08/02/2019   Epigastric abdominal pain 03/25/2020   Nausea 03/25/2020   Anterior cervical adenopathy 09/20/2021   Elevated blood pressure reading 09/20/2021   Resolved Ambulatory Problems    Diagnosis Date Noted   Hypothyroidism 11/04/2011   Depression 01/13/2016   Past Medical History:  Diagnosis Date   Hypertension    Thyroid disease     Review of Systems  All other  systems reviewed and are negative.     Objective:     BP 137/74   Pulse 89   Ht 5\' 4"  (1.626 m)   Wt 245 lb (111.1 kg)   SpO2 98%   BMI 42.05 kg/m  BP Readings from Last 3 Encounters:  04/20/22 137/74  01/12/22 126/82  09/20/21 138/80   Wt Readings from Last 3 Encounters:  04/20/22 245 lb (111.1 kg)  01/12/22 251 lb (113.9 kg)  09/20/21 252 lb (114.3 kg)      Physical Exam Constitutional:      Appearance: Normal appearance. She is obese.  Cardiovascular:     Rate and Rhythm: Normal rate and regular rhythm.  Pulmonary:     Effort: Pulmonary effort is normal.  Neurological:     General: No focal deficit present.     Mental Status: She is alert and oriented to person, place, and time.  Psychiatric:        Mood and Affect: Mood normal.      Results for orders placed or performed in visit on 04/20/22  POCT glycosylated hemoglobin (Hb A1C)  Result Value Ref Range   Hemoglobin A1C 5.6 4.0 - 5.6 %   HbA1c POC (<> result, manual entry)     HbA1c, POC (prediabetic range)     HbA1c, POC (controlled diabetic range)    POCT UA - Microalbumin  Result Value Ref Range   Microalbumin Ur, POC 80 mg/L   Creatinine, POC 100 mg/dL  Albumin/Creatinine Ratio, Urine, POC 30-300       Assessment & Plan:  Marland KitchenMarland KitchenAreyana was seen today for follow-up and diabetes.  Diagnoses and all orders for this visit:  Type 2 diabetes mellitus with hyperglycemia, without long-term current use of insulin (HCC) -     POCT glycosylated hemoglobin (Hb A1C) -     POCT UA - Microalbumin -     Semaglutide, 1 MG/DOSE, 4 MG/3ML SOPN; Inject 1 mg as directed once a week.  Needs flu shot -     Flu Vaccine QUAD 54mo+IM (Fluarix, Fluzone & Alfiuria Quad PF)  Need for shingles vaccine -     Varicella-zoster vaccine IM  Microalbuminuria -     COMPLETE METABOLIC PANEL WITH GFR  Anxiety  Acquired hypothyroidism -     COMPLETE METABOLIC PANEL WITH GFR -     TSH  Mixed hyperlipidemia -     Lipid Panel  w/reflex Direct LDL   A1C to goal Continue ozempic BP very close to goal Some protein in urine discussed ACE/ARB she declines today, discussed SGLT-2 declines today Foot exam UTD Eye exam UTD Flu and shingles vaccine given today Pt declined covid vaccine Follow up in 3 months   Iran Planas, PA-C

## 2022-04-22 ENCOUNTER — Encounter: Payer: Self-pay | Admitting: Physician Assistant

## 2022-04-22 DIAGNOSIS — R809 Proteinuria, unspecified: Secondary | ICD-10-CM | POA: Insufficient documentation

## 2022-04-29 DIAGNOSIS — E782 Mixed hyperlipidemia: Secondary | ICD-10-CM | POA: Diagnosis not present

## 2022-04-29 DIAGNOSIS — R809 Proteinuria, unspecified: Secondary | ICD-10-CM | POA: Diagnosis not present

## 2022-04-29 DIAGNOSIS — E039 Hypothyroidism, unspecified: Secondary | ICD-10-CM | POA: Diagnosis not present

## 2022-04-30 LAB — LIPID PANEL W/REFLEX DIRECT LDL
Cholesterol: 232 mg/dL — ABNORMAL HIGH (ref <200)
HDL: 46 mg/dL — ABNORMAL LOW (ref >=50)
LDL Cholesterol (Calc): 138 mg/dL (calc) — ABNORMAL HIGH
Non-HDL Cholesterol (Calc): 186 mg/dL (calc) — ABNORMAL HIGH (ref <130)
Total CHOL/HDL Ratio: 5 (calc) — ABNORMAL HIGH (ref <5.0)
Triglycerides: 335 mg/dL — ABNORMAL HIGH (ref <150)

## 2022-04-30 LAB — COMPLETE METABOLIC PANEL WITH GFR
AG Ratio: 1.3 (calc) (ref 1.0–2.5)
ALT: 29 U/L (ref 6–29)
AST: 39 U/L — ABNORMAL HIGH (ref 10–35)
Albumin: 4 g/dL (ref 3.6–5.1)
Alkaline phosphatase (APISO): 92 U/L (ref 37–153)
BUN: 14 mg/dL (ref 7–25)
CO2: 26 mmol/L (ref 20–32)
Calcium: 9.8 mg/dL (ref 8.6–10.4)
Chloride: 102 mmol/L (ref 98–110)
Creat: 0.76 mg/dL (ref 0.50–1.03)
Globulin: 3.1 g/dL (calc) (ref 1.9–3.7)
Glucose, Bld: 114 mg/dL — ABNORMAL HIGH (ref 65–99)
Potassium: 4.2 mmol/L (ref 3.5–5.3)
Sodium: 138 mmol/L (ref 135–146)
Total Bilirubin: 0.6 mg/dL (ref 0.2–1.2)
Total Protein: 7.1 g/dL (ref 6.1–8.1)
eGFR: 92 mL/min/{1.73_m2} (ref >=60)

## 2022-04-30 LAB — TSH: TSH: 0.19 mIU/L — ABNORMAL LOW

## 2022-05-02 ENCOUNTER — Other Ambulatory Visit: Payer: Self-pay | Admitting: Physician Assistant

## 2022-05-02 MED ORDER — LEVOTHYROXINE SODIUM 112 MCG PO TABS: 112.0000 ug | ORAL_TABLET | Freq: Every day | ORAL | 0 refills | Status: DC

## 2022-05-02 NOTE — Progress Notes (Signed)
Liver enzymes continue to get better!  TSH is too low meaning you are in more of a hyperthyroid state. Will decrease synthroid and recheck in 3 months.  Cholesterol does look some better but not to goal.  Are you taking the 4mg  livalo daily?

## 2022-07-13 ENCOUNTER — Other Ambulatory Visit: Payer: Self-pay | Admitting: Physician Assistant

## 2022-07-13 DIAGNOSIS — F419 Anxiety disorder, unspecified: Secondary | ICD-10-CM

## 2022-07-20 ENCOUNTER — Ambulatory Visit: Payer: BC Managed Care – PPO | Admitting: Physician Assistant

## 2022-07-30 ENCOUNTER — Other Ambulatory Visit: Payer: Self-pay | Admitting: Physician Assistant

## 2022-07-30 DIAGNOSIS — E039 Hypothyroidism, unspecified: Secondary | ICD-10-CM

## 2022-08-17 ENCOUNTER — Ambulatory Visit: Payer: BC Managed Care – PPO | Admitting: Physician Assistant

## 2022-08-31 ENCOUNTER — Ambulatory Visit (INDEPENDENT_AMBULATORY_CARE_PROVIDER_SITE_OTHER): Payer: BC Managed Care – PPO | Admitting: Physician Assistant

## 2022-08-31 ENCOUNTER — Encounter: Payer: Self-pay | Admitting: Physician Assistant

## 2022-08-31 VITALS — BP 138/83 | HR 78 | Ht 64.0 in | Wt 249.1 lb

## 2022-08-31 DIAGNOSIS — I10 Essential (primary) hypertension: Secondary | ICD-10-CM

## 2022-08-31 DIAGNOSIS — K5903 Drug induced constipation: Secondary | ICD-10-CM

## 2022-08-31 DIAGNOSIS — F419 Anxiety disorder, unspecified: Secondary | ICD-10-CM

## 2022-08-31 DIAGNOSIS — Z6841 Body Mass Index (BMI) 40.0 and over, adult: Secondary | ICD-10-CM

## 2022-08-31 DIAGNOSIS — E1165 Type 2 diabetes mellitus with hyperglycemia: Secondary | ICD-10-CM | POA: Diagnosis not present

## 2022-08-31 DIAGNOSIS — E039 Hypothyroidism, unspecified: Secondary | ICD-10-CM | POA: Diagnosis not present

## 2022-08-31 DIAGNOSIS — E785 Hyperlipidemia, unspecified: Secondary | ICD-10-CM

## 2022-08-31 LAB — POCT GLYCOSYLATED HEMOGLOBIN (HGB A1C): Hemoglobin A1C: 6.4 % — AB (ref 4.0–5.6)

## 2022-08-31 MED ORDER — BUPROPION HCL ER (XL) 150 MG PO TB24: 150.0000 mg | ORAL_TABLET | Freq: Every day | ORAL | 3 refills | Status: DC

## 2022-08-31 MED ORDER — HYDROCHLOROTHIAZIDE 25 MG PO TABS: 25.0000 mg | ORAL_TABLET | Freq: Every day | ORAL | 3 refills | Status: DC

## 2022-08-31 MED ORDER — SEMAGLUTIDE (1 MG/DOSE) 4 MG/3ML ~~LOC~~ SOPN: 1.0000 mg | PEN_INJECTOR | SUBCUTANEOUS | 0 refills | Status: DC

## 2022-08-31 NOTE — Patient Instructions (Signed)
Miralax for constipation

## 2022-09-02 ENCOUNTER — Encounter: Payer: Self-pay | Admitting: Physician Assistant

## 2022-09-02 DIAGNOSIS — K5903 Drug induced constipation: Secondary | ICD-10-CM | POA: Insufficient documentation

## 2022-09-02 NOTE — Progress Notes (Signed)
Established Patient Office Visit  Subjective   Patient ID: Kelly Reeves, female    DOB: [birth year]  Age: 57 y.o. MRN: [id number]  Chief Complaint  Patient presents with   Follow-up    HPI Pt is a 57 yo obese female with T2DM, HTN, PCOS, HYpothyroidism, HLD who presents to the clinic for 3 month follow up.   Pt is doing ok. She is taking her medication. She does have some constipation with ozempic. Wants to know more things to try. Not checking sugars. She is checking BP and running 120s over 70s. No CP, palpitations, headaches, or vision changes. Denies any hypoglycemic symptoms. She is not exercising or eating like she should.   .. Active Ambulatory Problems    Diagnosis Date Noted   Hypothyroidism 11/10/2010   Hyperlipidemia 10/22/2011   Essential hypertension 10/22/2011   PCOS (polycystic ovarian syndrome) 10/22/2011   Severe obesity (BMI >= 40) (HCC) 10/24/2012   Obesity, Class III, BMI 40-49.9 (morbid obesity) ([place]) 08/21/2013   Onychomycosis 08/21/2013   Class 3 severe obesity due to excess calories with serious comorbidity and body mass index (BMI) of 40.0 to 44.9 in adult ([place]) 10/16/2014   Thyroid activity decreased 10/16/2014   Abnormal weight gain 10/16/2014   Rash and nonspecific skin eruption 07/15/2015   Pre-diabetes 12/18/2015   Elevated liver enzymes 12/18/2015   Right ankle pain 12/29/2015   Gallstones 12/30/2015   Large breasts 02/17/2016   Morbid obesity ([place]) 05/18/2016   Hematuria 123456   Metabolic syndrome 123XX123   B12 deficiency 04/23/2019   Dyslipidemia 04/29/2019   Type 2 diabetes mellitus with hyperglycemia, without long-term current use of insulin ([place]) 08/02/2019   Anxiety 08/02/2019   Epigastric abdominal pain 03/25/2020   Nausea 03/25/2020   Anterior cervical adenopathy 09/20/2021   Elevated blood pressure reading 09/20/2021   Microalbuminuria 04/22/2022   Resolved Ambulatory Problems    Diagnosis Date Noted   Hypothyroidism  11/04/2011   Depression 01/13/2016   Past Medical History:  Diagnosis Date   Hypertension    Thyroid disease        Review of Systems  All other systems reviewed and are negative.     Objective:     BP 138/83 (BP Location: Left Arm, Patient Position: Sitting, Cuff Size: Normal)   Pulse 78   Ht '5\' 4"'$  (1.626 m)   Wt 249 lb 1.4 oz (113 kg)   SpO2 98%   BMI 42.76 kg/m  BP Readings from Last 3 Encounters:  08/31/22 138/83  04/20/22 137/74  01/12/22 126/82   Wt Readings from Last 3 Encounters:  08/31/22 249 lb 1.4 oz (113 kg)  04/20/22 245 lb (111.1 kg)  01/12/22 251 lb (113.9 kg)   ..    08/31/2022   10:50 AM 04/20/2022   11:26 AM 01/12/2022   11:07 AM 09/01/2021   11:17 AM 03/17/2021    1:43 PM  Depression screen PHQ 2/9  Decreased Interest 0 0 0 0 2  Down, Depressed, Hopeless 0 0 0 0 2  PHQ - 2 Score 0 0 0 0 4  Altered sleeping     2  Tired, decreased energy     2  Change in appetite     2  Feeling bad or failure about yourself      2  Trouble concentrating     3  Moving slowly or fidgety/restless     0  Suicidal thoughts     0  PHQ-9 Score  15  Difficult doing work/chores     Extremely dIfficult       Physical Exam Constitutional:      Appearance: Normal appearance. She is obese.  Cardiovascular:     Rate and Rhythm: Normal rate and regular rhythm.     Pulses: Normal pulses.  Pulmonary:     Effort: Pulmonary effort is normal.     Breath sounds: Normal breath sounds.  Musculoskeletal:     Cervical back: No tenderness.  Lymphadenopathy:     Cervical: No cervical adenopathy.  Neurological:     General: No focal deficit present.     Mental Status: She is alert and oriented to person, place, and time.  Psychiatric:        Mood and Affect: Mood normal.      Results for orders placed or performed in visit on 08/31/22  POCT glycosylated hemoglobin (Hb A1C)  Result Value Ref Range   Hemoglobin A1C 6.4 (A) 4.0 - 5.6 %   HbA1c POC (<> result,  manual entry)     HbA1c, POC (prediabetic range)     HbA1c, POC (controlled diabetic range)       The 10-year ASCVD risk score (Arnett DK, et al., 2019) is: 8.8%    Assessment & Plan:  Marland KitchenMarland KitchenAnesia was seen today for follow-up.  Diagnoses and all orders for this visit:  Type 2 diabetes mellitus with hyperglycemia, without long-term current use of insulin (HCC) -     POCT glycosylated hemoglobin (Hb A1C) -     COMPLETE METABOLIC PANEL WITH GFR -     Semaglutide, 1 MG/DOSE, 4 MG/3ML SOPN; Inject 1 mg as directed once a week.  Anxiety -     buPROPion (WELLBUTRIN XL) 150 MG 24 hr tablet; Take 1 tablet (150 mg total) by mouth daily.  Essential hypertension -     hydrochlorothiazide (HYDRODIURIL) 25 MG tablet; Take 1 tablet (25 mg total) by mouth daily. -     COMPLETE METABOLIC PANEL WITH GFR  Acquired hypothyroidism -     TSH -     COMPLETE METABOLIC PANEL WITH GFR  Dyslipidemia, goal LDL below 70 -     Lipid Panel w/reflex Direct LDL -     COMPLETE METABOLIC PANEL WITH GFR  Drug-induced constipation   A1C to goal.  Continue on same medications Increased ozempic to '1mg'$  weekly Discussed to use miralax for constipation Cmp ordered PHQ/GAD stable On livalo, LDL not to goal will recheck and discuss adding medication if LDL not under 70.  Wellbutrin refilled BP very close to goal and getting good readings at home refilled HCTZ Reminded of eye exam Foot exam UTD Vaccines UTD Follow up in 3 months  TSH ordered and will refill medication accordingly   Return in about 3 months (around 11/29/2022).    Iran Planas, PA-C

## 2022-10-29 ENCOUNTER — Other Ambulatory Visit: Payer: Self-pay | Admitting: Physician Assistant

## 2022-12-07 ENCOUNTER — Ambulatory Visit (INDEPENDENT_AMBULATORY_CARE_PROVIDER_SITE_OTHER): Payer: BC Managed Care – PPO | Admitting: Physician Assistant

## 2022-12-07 ENCOUNTER — Encounter: Payer: Self-pay | Admitting: Physician Assistant

## 2022-12-07 VITALS — BP 142/82 | HR 82 | Ht 64.0 in | Wt 241.0 lb

## 2022-12-07 DIAGNOSIS — Z794 Long term (current) use of insulin: Secondary | ICD-10-CM

## 2022-12-07 DIAGNOSIS — E039 Hypothyroidism, unspecified: Secondary | ICD-10-CM | POA: Diagnosis not present

## 2022-12-07 DIAGNOSIS — E1165 Type 2 diabetes mellitus with hyperglycemia: Secondary | ICD-10-CM | POA: Diagnosis not present

## 2022-12-07 DIAGNOSIS — I10 Essential (primary) hypertension: Secondary | ICD-10-CM

## 2022-12-07 DIAGNOSIS — E785 Hyperlipidemia, unspecified: Secondary | ICD-10-CM

## 2022-12-07 DIAGNOSIS — K219 Gastro-esophageal reflux disease without esophagitis: Secondary | ICD-10-CM | POA: Insufficient documentation

## 2022-12-07 LAB — POCT GLYCOSYLATED HEMOGLOBIN (HGB A1C): Hemoglobin A1C: 6.2 % — AB (ref 4.0–5.6)

## 2022-12-07 MED ORDER — FAMOTIDINE 20 MG PO TABS: 20.0000 mg | ORAL_TABLET | Freq: Two times a day (BID) | ORAL | 1 refills | Status: DC

## 2022-12-07 NOTE — Progress Notes (Signed)
Established Patient Office Visit  Subjective   Patient ID: Kelly Reeves, female    DOB: Apr 13, 1966  Age: 57 y.o. MRN: [id number]  Chief Complaint  Patient presents with   Follow-up    Pt is a 57 yo obese female with T2DM, HTN, PCOS, HYpothyroidism, HLD who presents to the clinic for 3 month follow up.    Pt is doing better. She has been taking the Ozempic regularly for the past 6wks but at the 0.75mg  dose to reportedly reduce side effects. She is experiencing troblesome reflex that started simultaneously with the Ozempic and resolves with omeprazole but she is afraid to take it for more than 14 days due to directions on bottle. Patient has also increased exercise stating she closes her apple watch activity rings every day. Cites decreased appetite with Ozempic. No CP, palpitations, headaches, or vision changes. Denies any hypoglycemic symptoms.    ROS per HPI    Objective:     BP (!) 142/82   Pulse 82   Ht 5\' 4"  (1.626 m)   Wt 241 lb (109.3 kg)   SpO2 99%   BMI 41.37 kg/m  BP Readings from Last 3 Encounters:  12/07/22 (!) 142/82  08/31/22 138/83  04/20/22 137/74   Wt Readings from Last 3 Encounters:  12/07/22 241 lb (109.3 kg)  08/31/22 249 lb 1.4 oz (113 kg)  04/20/22 245 lb (111.1 kg)      Physical Exam Constitutional:      Appearance: Normal appearance. She is obese.  Cardiovascular:     Rate and Rhythm: Normal rate and regular rhythm.     Heart sounds: Normal heart sounds.  Pulmonary:     Effort: Pulmonary effort is normal.     Breath sounds: Normal breath sounds.  Musculoskeletal:        General: No swelling.  Neurological:     General: No focal deficit present.     Mental Status: She is alert and oriented to person, place, and time.     Results for orders placed or performed in visit on 12/07/22  POCT HgB A1C  Result Value Ref Range   Hemoglobin A1C 6.2 (A) 4.0 - 5.6 %   HbA1c POC (<> result, manual entry)     HbA1c, POC (prediabetic range)      HbA1c, POC (controlled diabetic range)       The 10-year ASCVD risk score (Arnett DK, et al., 2019) is: 9.3%    Assessment & Plan:  Marland KitchenMarland KitchenAlaisia was seen today for follow-up.  Diagnoses and all orders for this visit:  Type 2 diabetes mellitus with hyperglycemia, without long-term current use of insulin (HCC) -     POCT HgB A1C -     POCT UA - Microalbumin -     COMPLETE METABOLIC PANEL WITH GFR  Essential hypertension -     COMPLETE METABOLIC PANEL WITH GFR  Acquired hypothyroidism -     TSH  Dyslipidemia, goal LDL below 70 -     Lipid Panel w/reflex Direct LDL -     COMPLETE METABOLIC PANEL WITH GFR  Gastroesophageal reflux disease without esophagitis -     famotidine (PEPCID) 20 MG tablet; Take 1 tablet (20 mg total) by mouth 2 (two) times daily.   A1C to goal.  Continue on same medications Increased ozempic to 1mg  weekly Discussed trying pepcid daily to avoid long term effects of omeprazole. Foot exam UTD. Reminded of eye exam. Vaccines UTD  PHQ/GAD stable Wellbutrin refilled  On  livalo, LDL not to goal will recheck and discuss adding medication if LDL not under 70.   BP very close to goal and getting good readings at home refilled HCTZ CMP, FLP, and microalbumin ordered. Will adjust Livalo accordingly.   TSH ordered and will refill medication accordingly.     Return in about 3 months (around 03/09/2023).    Tandy Gaw, PA-C

## 2022-12-08 LAB — COMPLETE METABOLIC PANEL WITH GFR
AG Ratio: 1.6 (calc) (ref 1.0–2.5)
ALT: 26 U/L (ref 6–29)
AST: 29 U/L (ref 10–35)
Albumin: 4.6 g/dL (ref 3.6–5.1)
Alkaline phosphatase (APISO): 101 U/L (ref 37–153)
BUN: 13 mg/dL (ref 7–25)
CO2: 28 mmol/L (ref 20–32)
Calcium: 10.5 mg/dL — ABNORMAL HIGH (ref 8.6–10.4)
Chloride: 99 mmol/L (ref 98–110)
Creat: 0.68 mg/dL (ref 0.50–1.03)
Globulin: 2.9 g/dL (calc) (ref 1.9–3.7)
Glucose, Bld: 99 mg/dL (ref 65–99)
Potassium: 4.1 mmol/L (ref 3.5–5.3)
Sodium: 139 mmol/L (ref 135–146)
Total Bilirubin: 0.7 mg/dL (ref 0.2–1.2)
Total Protein: 7.5 g/dL (ref 6.1–8.1)
eGFR: 102 mL/min/{1.73_m2} (ref >=60)

## 2022-12-08 LAB — TSH: TSH: 0.12 mIU/L — ABNORMAL LOW (ref 0.40–4.50)

## 2022-12-08 LAB — LIPID PANEL W/REFLEX DIRECT LDL
Cholesterol: 212 mg/dL — ABNORMAL HIGH (ref <200)
HDL: 49 mg/dL — ABNORMAL LOW (ref >=50)
LDL Cholesterol (Calc): 124 mg/dL (calc) — ABNORMAL HIGH
Non-HDL Cholesterol (Calc): 163 mg/dL (calc) — ABNORMAL HIGH (ref <130)
Total CHOL/HDL Ratio: 4.3 (calc) (ref <5.0)
Triglycerides: 240 mg/dL — ABNORMAL HIGH (ref <150)

## 2022-12-09 ENCOUNTER — Other Ambulatory Visit: Payer: Self-pay | Admitting: Physician Assistant

## 2022-12-09 MED ORDER — LEVOTHYROXINE SODIUM 100 MCG PO TABS: 100.0000 ug | ORAL_TABLET | Freq: Every day | ORAL | 0 refills | Status: DC

## 2022-12-09 NOTE — Progress Notes (Signed)
Kelly Reeves,   TSH still too low. Decreased synthroid to daily. Recheck TSH in 3 months.  Liver enzymes look better and in normal range.  TG MUCH better but still not to normal range. LDL is not to goal either.  You are taking livalo 4mg  daily?

## 2022-12-31 ENCOUNTER — Other Ambulatory Visit: Payer: Self-pay | Admitting: Physician Assistant

## 2022-12-31 DIAGNOSIS — E782 Mixed hyperlipidemia: Secondary | ICD-10-CM

## 2023-01-10 ENCOUNTER — Other Ambulatory Visit: Payer: Self-pay | Admitting: Physician Assistant

## 2023-01-10 DIAGNOSIS — E1165 Type 2 diabetes mellitus with hyperglycemia: Secondary | ICD-10-CM

## 2023-01-11 ENCOUNTER — Other Ambulatory Visit: Payer: Self-pay | Admitting: Physician Assistant

## 2023-02-01 ENCOUNTER — Other Ambulatory Visit: Payer: Self-pay | Admitting: Physician Assistant

## 2023-02-13 ENCOUNTER — Other Ambulatory Visit: Payer: Self-pay | Admitting: Physician Assistant

## 2023-02-13 MED ORDER — LEVOTHYROXINE SODIUM 100 MCG PO TABS: 100.0000 ug | ORAL_TABLET | Freq: Every day | ORAL | 0 refills | Status: DC

## 2023-02-14 NOTE — Telephone Encounter (Signed)
Attempted call to pharmacy x 2 and each time was connected to voice mail (not to pharmacist) .

## 2023-02-16 NOTE — Telephone Encounter (Signed)
Patient informed. 

## 2023-02-17 ENCOUNTER — Other Ambulatory Visit: Payer: Self-pay | Admitting: Physician Assistant

## 2023-02-17 DIAGNOSIS — E1165 Type 2 diabetes mellitus with hyperglycemia: Secondary | ICD-10-CM

## 2023-03-15 ENCOUNTER — Telehealth: Payer: Self-pay

## 2023-03-15 ENCOUNTER — Encounter: Payer: Self-pay | Admitting: Physician Assistant

## 2023-03-15 ENCOUNTER — Ambulatory Visit (INDEPENDENT_AMBULATORY_CARE_PROVIDER_SITE_OTHER): Payer: BC Managed Care – PPO | Admitting: Physician Assistant

## 2023-03-15 VITALS — BP 129/84 | HR 71 | Ht 64.0 in | Wt 233.0 lb

## 2023-03-15 DIAGNOSIS — Z7985 Long-term (current) use of injectable non-insulin antidiabetic drugs: Secondary | ICD-10-CM

## 2023-03-15 DIAGNOSIS — E1165 Type 2 diabetes mellitus with hyperglycemia: Secondary | ICD-10-CM | POA: Diagnosis not present

## 2023-03-15 DIAGNOSIS — K2100 Gastro-esophageal reflux disease with esophagitis, without bleeding: Secondary | ICD-10-CM | POA: Insufficient documentation

## 2023-03-15 DIAGNOSIS — Z23 Encounter for immunization: Secondary | ICD-10-CM

## 2023-03-15 DIAGNOSIS — Z6833 Body mass index (BMI) 33.0-33.9, adult: Secondary | ICD-10-CM

## 2023-03-15 DIAGNOSIS — E6609 Other obesity due to excess calories: Secondary | ICD-10-CM

## 2023-03-15 LAB — POCT GLYCOSYLATED HEMOGLOBIN (HGB A1C): Hemoglobin A1C: 6.1 % — AB (ref 4.0–5.6)

## 2023-03-15 MED ORDER — OMEPRAZOLE 40 MG PO CPDR
40.0000 mg | DELAYED_RELEASE_CAPSULE | Freq: Every day | ORAL | 1 refills | Status: DC
Start: 2023-03-15 — End: 2023-10-19

## 2023-03-15 MED ORDER — OZEMPIC (1 MG/DOSE) 4 MG/3ML ~~LOC~~ SOPN: 1.0000 mg | PEN_INJECTOR | SUBCUTANEOUS | 0 refills | Status: DC

## 2023-03-15 NOTE — Progress Notes (Unsigned)
   Established Patient Office Visit  Subjective   Patient ID: Kelly Reeves, female    DOB: Apr 03, 1966  Age: 57 y.o. MRN: [id number]  Chief Complaint  Patient presents with   Medical Management of Chronic Issues    Last A1c 6.2    HPI  Patient is in today for management of type II diabetes. Patient states that she has been consistently taking her Ozempic injections on Sunday evenings. A1c today in office was 6.1. Patient does report side effects of acid reflux and constipation. She denies any nausea, vomiting, or abdominal pain. Patient states that she has started using omeprazole instead of famotidine at home occasionally for her acid reflux and reports relief. Patient states that she has increased her fluid intake to 60-80 ounces of water daily along with aiming to achieve 5k-7.5k steps daily. She feels this has helped some with her constipation.   {History (Optional):23778}  ROS    Objective:     BP 137/76   Pulse 71   Ht 5\' 4"  (1.626 m)   Wt 105.7 kg   SpO2 99%   BMI 39.99 kg/m  {Vitals History (Optional):23777}  Physical Exam   Results for orders placed or performed in visit on 03/15/23  POCT HgB A1C  Result Value Ref Range   Hemoglobin A1C 6.1 (A) 4.0 - 5.6 %   HbA1c POC (<> result, manual entry)     HbA1c, POC (prediabetic range)     HbA1c, POC (controlled diabetic range)      {Labs (Optional):23779}  The 10-year ASCVD risk score (Arnett DK, et al., 2019) is: 7.5%    Assessment & Plan:   Problem List Items Addressed This Visit       Digestive   Gastroesophageal reflux disease with esophagitis without hemorrhage     Endocrine   Type 2 diabetes mellitus with hyperglycemia, without long-term current use of insulin (HCC) - Primary   Relevant Medications   Semaglutide, 1 MG/DOSE, (OZEMPIC, 1 MG/DOSE,) 4 MG/3ML SOPN   Other Relevant Orders   POCT HgB A1C (Completed)   Flu vaccine trivalent PF, 6mos and older(Flulaval,Afluria,Fluarix,Fluzone)  (Completed)     Other   Class 1 obesity due to excess calories with serious comorbidity and body mass index (BMI) of 33.0 to 33.9 in adult   Relevant Medications   Semaglutide, 1 MG/DOSE, (OZEMPIC, 1 MG/DOSE,) 4 MG/3ML SOPN    Return in about 3 months (around 06/14/2023).    Cira Rue, Stuart

## 2023-03-15 NOTE — Telephone Encounter (Signed)
Initiated Prior authorization WNU:[id number] (1 MG/DOSE) 4MG /3ML pen-injectors Via: Covermymeds Case/Key: [id number] Status: approved  as of 03/15/23 Reason:Coverage End Date:03/14/2024; Notified Pt via: Mychart

## 2023-03-16 ENCOUNTER — Encounter: Payer: Self-pay | Admitting: Physician Assistant

## 2023-03-22 ENCOUNTER — Other Ambulatory Visit: Payer: Self-pay | Admitting: Obstetrics and Gynecology

## 2023-03-22 DIAGNOSIS — Z1231 Encounter for screening mammogram for malignant neoplasm of breast: Secondary | ICD-10-CM

## 2023-04-19 ENCOUNTER — Ambulatory Visit
Admission: RE | Admit: 2023-04-19 | Discharge: 2023-04-19 | Disposition: A | Discharge status: Home / Self Care | Payer: BC Managed Care – PPO | Source: Ambulatory Visit | Attending: Obstetrics and Gynecology | Admitting: Obstetrics and Gynecology

## 2023-04-19 DIAGNOSIS — Z1231 Encounter for screening mammogram for malignant neoplasm of breast: Secondary | ICD-10-CM

## 2023-05-02 ENCOUNTER — Ambulatory Visit (INDEPENDENT_AMBULATORY_CARE_PROVIDER_SITE_OTHER): Payer: BC Managed Care – PPO | Admitting: Physician Assistant

## 2023-05-02 ENCOUNTER — Encounter: Payer: Self-pay | Admitting: Physician Assistant

## 2023-05-02 ENCOUNTER — Other Ambulatory Visit: Payer: Self-pay | Admitting: Physician Assistant

## 2023-05-02 VITALS — BP 160/78 | HR 115 | Temp 100.0°F | Ht 64.0 in | Wt 240.0 lb

## 2023-05-02 DIAGNOSIS — K1120 Sialoadenitis, unspecified: Secondary | ICD-10-CM | POA: Diagnosis not present

## 2023-05-02 LAB — CMP14+EGFR
ALT: 31 [IU]/L (ref 0–32)
AST: 23 [IU]/L (ref 0–40)
Albumin: 4 g/dL (ref 3.8–4.9)
Alkaline Phosphatase: 150 [IU]/L — ABNORMAL HIGH (ref 44–121)
BUN/Creatinine Ratio: 15 (ref 9–23)
BUN: 11 mg/dL (ref 6–24)
Bilirubin Total: 0.5 mg/dL (ref 0.0–1.2)
CO2: 28 mmol/L (ref 20–29)
Calcium: 10.1 mg/dL (ref 8.7–10.2)
Chloride: 98 mmol/L (ref 96–106)
Creatinine, Ser: 0.75 mg/dL (ref 0.57–1.00)
Globulin, Total: 2.5 g/dL (ref 1.5–4.5)
Glucose: 142 mg/dL — ABNORMAL HIGH (ref 70–99)
Potassium: 3.3 mmol/L — ABNORMAL LOW (ref 3.5–5.2)
Sodium: 140 mmol/L (ref 134–144)
Total Protein: 6.5 g/dL (ref 6.0–8.5)
eGFR: 93 mL/min/{1.73_m2} (ref >59)

## 2023-05-02 LAB — CBC WITH DIFFERENTIAL/PLATELET
Basophils Absolute: 0 10*3/uL (ref 0.0–0.2)
Basos: 1 %
EOS (ABSOLUTE): 0.1 10*3/uL (ref 0.0–0.4)
Eos: 2 %
Hematocrit: 38.3 % (ref 34.0–46.6)
Hemoglobin: 12.7 g/dL (ref 11.1–15.9)
Immature Granulocytes: 0 %
Lymphocytes Absolute: 2 10*3/uL (ref 0.7–3.1)
Lymphs: 26 %
MCH: 28.8 pg (ref 26.6–33.0)
MCHC: 33.2 g/dL (ref 31.5–35.7)
MCV: 87 fL (ref 79–97)
Monocytes Absolute: 0.6 10*3/uL (ref 0.1–0.9)
Monocytes: 8 %
Neutrophils Absolute: 5 10*3/uL (ref 1.4–7.0)
Neutrophils: 63 %
Platelets: 430 10*3/uL (ref 150–450)
RBC: 4.41 x10E6/uL (ref 3.77–5.28)
RDW: 14.1 % (ref 11.7–15.4)
WBC: 7.8 10*3/uL (ref 3.4–10.8)

## 2023-05-02 LAB — POC COVID19 BINAXNOW: SARS Coronavirus 2 Ag: NEGATIVE

## 2023-05-02 LAB — POCT RAPID STREP A (OFFICE): Rapid Strep A Screen: NEGATIVE

## 2023-05-02 LAB — POCT INFLUENZA A/B
Influenza A, POC: NEGATIVE
Influenza B, POC: NEGATIVE

## 2023-05-02 MED ORDER — AMOXICILLIN-POT CLAVULANATE 875-125 MG PO TABS: 1.0000 | ORAL_TABLET | Freq: Two times a day (BID) | ORAL | 0 refills | Status: DC

## 2023-05-02 NOTE — Progress Notes (Unsigned)
Acute Office Visit  Subjective:     Patient ID: Kelly Reeves, female    DOB: Jun 02, 1966, 57 y.o.   MRN: [id number]  Chief Complaint  Patient presents with   Facial Swelling    Facial swelling x 2 days     HPI Patient is in today for left sided parotid swelling for the last 2 days. Pt denies any fever, chills, body aches, ST, sinus pressure, URI symptoms. She has tried some ice packs over swelling but nothing else. She has never had anything like this before. She denies any pain but there is tenderness over parotid gland to palpation.   .. Active Ambulatory Problems    Diagnosis Date Noted   Hypothyroidism 11/10/2010   Dyslipidemia, goal LDL below 70 10/22/2011   Essential hypertension 10/22/2011   PCOS (polycystic ovarian syndrome) 10/22/2011   Severe obesity (BMI >= 40) (HCC) 10/24/2012   Obesity, Class III, BMI 40-49.9 (morbid obesity) (HCC) 08/21/2013   Onychomycosis 08/21/2013   Class 3 severe obesity due to excess calories with serious comorbidity and body mass index (BMI) of 40.0 to 44.9 in adult (HCC) 10/16/2014   Thyroid activity decreased 10/16/2014   Abnormal weight gain 10/16/2014   Rash and nonspecific skin eruption 07/15/2015   Pre-diabetes 12/18/2015   Elevated liver enzymes 12/18/2015   Right ankle pain 12/29/2015   Gallstones 12/30/2015   Large breasts 02/17/2016   Morbid obesity (HCC) 05/18/2016   Hematuria 02/22/2017   Metabolic syndrome 12/21/2017   B12 deficiency 04/23/2019   Dyslipidemia 04/29/2019   Type 2 diabetes mellitus with hyperglycemia, without long-term current use of insulin (HCC) 08/02/2019   Anxiety 08/02/2019   Epigastric abdominal pain 03/25/2020   Nausea 03/25/2020   Anterior cervical adenopathy 09/20/2021   Elevated blood pressure reading 09/20/2021   Microalbuminuria 04/22/2022   Drug-induced constipation 09/02/2022   Gastroesophageal reflux disease without esophagitis 12/07/2022   Gastroesophageal reflux disease with  esophagitis without hemorrhage 03/15/2023   Class 1 obesity due to excess calories with serious comorbidity and body mass index (BMI) of 33.0 to 33.9 in adult 03/15/2023   Parotiditis 05/02/2023   Resolved Ambulatory Problems    Diagnosis Date Noted   Hypothyroidism 11/04/2011   Depression 01/13/2016   Past Medical History:  Diagnosis Date   Hyperlipidemia    Hypertension    Thyroid disease      ROS See HPI.      Objective:    BP (!) 160/78   Pulse (!) 115   Temp 100 F (37.8 C) (Oral)   Ht 5\' 4"  (1.626 m)   Wt 240 lb (108.9 kg)   SpO2 99%   BMI 41.20 kg/m  BP Readings from Last 3 Encounters:  05/02/23 (!) 160/78  03/15/23 129/84  12/07/22 (!) 142/82   Wt Readings from Last 3 Encounters:  05/02/23 240 lb (108.9 kg)  03/15/23 233 lb (105.7 kg)  12/07/22 241 lb (109.3 kg)      Physical Exam Constitutional:      Appearance: Normal appearance. She is obese.  HENT:     Head: Normocephalic.     Right Ear: Tympanic membrane, ear canal and external ear normal. There is no impacted cerumen.     Left Ear: Tympanic membrane, ear canal and external ear normal. There is no impacted cerumen.     Nose: Nose normal.     Mouth/Throat:     Mouth: Mucous membranes are moist.  Eyes:     Extraocular Movements: Extraocular movements intact.  Conjunctiva/sclera: Conjunctivae normal.     Pupils: Pupils are equal, round, and reactive to light.  Neck:     Comments: Swollen, firm, tender left sided parotid gland with tender cervical adenopathy.  Cardiovascular:     Rate and Rhythm: Normal rate and regular rhythm.     Pulses: Normal pulses.  Pulmonary:     Effort: Pulmonary effort is normal.     Breath sounds: Normal breath sounds.  Musculoskeletal:     Cervical back: Normal range of motion and neck supple. Tenderness present.  Lymphadenopathy:     Cervical: Cervical adenopathy present.  Neurological:     Mental Status: She is alert.      Results for orders placed  or performed in visit on 05/02/23  POCT rapid strep A  Result Value Ref Range   Rapid Strep A Screen Negative Negative  POCT Influenza A/B  Result Value Ref Range   Influenza A, POC Negative Negative   Influenza B, POC Negative Negative  POC COVID-19  Result Value Ref Range   SARS Coronavirus 2 Ag Negative Negative        Assessment & Plan:  Marland KitchenMarland KitchenJoselyne was seen today for facial swelling.  Diagnoses and all orders for this visit:  Parotiditis -     POCT rapid strep A -     POCT Influenza A/B -     POC COVID-19 -     Mumps Antibody, IgM -     EBV ab to viral capsid ag pnl, IgG+IgM -     CBC w/Diff/Platelet -     amoxicillin-clavulanate (AUGMENTIN) 875-125 MG tablet; Take 1 tablet by mouth 2 (two) times daily. -     CMP14+EGFR   Negative covid/flu/strep Labs to test for mumps and EBV Start augmentin to cover staph causes of parotitis.  Warm compresses Start ibuprofen 800mg  three times a day Rest and hydrate Follow up as needed if symptoms worsen or persist    Return if symptoms worsen or fail to improve.  Tandy Gaw, PA-C

## 2023-05-02 NOTE — Patient Instructions (Signed)
Parotitis  Parotitis means that you have irritation and swelling (inflammation) in one or both of your parotid glands. These glands make saliva. They are found on each side of your face, below and in front of your earlobes. You may or may not have pain with this condition. What are the causes? This condition may be caused by: Infections from germs (bacteria or viruses). Something blocking the flow of saliva through the parotid glands. This can be a stone, scar tissue, or a tumor. Diseases that cause your body's defense system (immune system) to attack healthy cells in your salivary glands. These are called autoimmune diseases. What increases the risk? Being 56 years old or older. Not drinking enough fluids (being dehydrated). Drinking too much alcohol. Having: A dry mouth. Diabetes. Gout. A long-term illness. Not taking good care of your mouth and teeth (poor oral hygiene). Having had radiation treatments to the head and neck. Taking certain medicines. What are the signs or symptoms? Swelling under and in front of the ear. This may get worse after you eat. Pain and tenderness over the parotid gland. This may get worse after you eat. Redness and warmth of the skin over the parotid gland. Fever or chills. Pus coming from the ducts inside the mouth. Dry mouth. A bad taste in the mouth. How is this treated? Treatment depends on the cause. It may include: Antibiotic medicine for an infection from bacteria. NSAIDs, such as ibuprofen, to treat pain and swelling. Drinking more fluids. Removing a stone or obstruction. Treating a disease that is causing parotitis. Surgery to drain an infection, remove a growth, or remove the whole gland. Treatment may not be needed if the swelling goes away with home care. Follow these instructions at home: Medicines  Take over-the-counter and prescription medicines only as told by your doctor. If you were prescribed an antibiotic medicine, take it as  told by your doctor. Do not stop taking it even if you start to feel better. Managing pain and swelling If told, put heat on the affected area. Do this as often as told by your doctor. Use the heat source that your doctor recommends, such as a moist heat pack or a heating pad. Place a towel between your skin and the heat source. Leave the heat on for 20-30 minutes. Take off the heat if your skin turns bright red. This is very important. If you cannot feel pain, heat, or cold, you have a greater risk of getting burned. Gargle with salt water 3-4 times a day or as needed. To make salt water, dissolve -1 tsp (3-6 g) of salt in 1 cup (237 mL) of warm water. Gently rub your parotid glands as told by your doctor. General instructions  Drink enough fluid to keep your pee (urine) pale yellow. Keep your mouth clean and moist. Suck on sour candy. This may help to: Make your mouth less dry. Make more saliva. Take good care of your mouth: Brush your teeth at least two times a day. Floss your teeth every day. See your dentist regularly. Do not smoke or use any products that contain nicotine or tobacco. If you need help quitting, ask your doctor. Do not drink alcohol. Keep all follow-up visits. Contact a doctor if: You have a fever or chills. You have new symptoms. Your symptoms get worse. Your symptoms do not get better with treatment. Get help right away if: You have trouble breathing or swallowing. These symptoms may be an emergency. Get help right away. Call your  local emergency services (911 in the [place] Do not wait to see if the symptoms will go away. Do not drive yourself to the hospital. Summary Parotitis means that you have irritation and swelling (inflammation) in one or both of your parotid glands. Symptoms include pain and swelling under and in front of the ear. Treatment for parotitis depends on the cause. In some cases, the condition may go away on its own with home care. You  should drink plenty of fluids, take good care of your mouth, and do not use products that contain nicotine or tobacco. This information is not intended to replace advice given to you by your health care provider. Make sure you discuss any questions you have with your health care provider. Document Revised: 10/30/2020 Document Reviewed: 10/30/2020 Elsevier Patient Education  2024 [employer].

## 2023-05-03 NOTE — Progress Notes (Signed)
WBC in normal range.  Other labs pending.  Potassium a little low. Increase potassium rich foods in diet.

## 2023-05-05 LAB — MUMPS ANTIBODY, IGM: Mumps IgM: <0.8 [AU] (ref 0.00–0.79)

## 2023-05-05 LAB — EBV AB TO VIRAL CAPSID AG PNL, IGG+IGM
EBV VCA IgG: 124 U/mL — ABNORMAL HIGH (ref 0.0–17.9)
EBV VCA IgM: <36 U/mL (ref 0.0–35.9)

## 2023-05-05 NOTE — Progress Notes (Signed)
No recent exposure to mono. Hx of past exposure.  Mumps still pending.  How are you feeling and how is swelling?

## 2023-05-05 NOTE — Progress Notes (Signed)
Negative for mumps. How are you feeling?

## 2023-05-10 ENCOUNTER — Telehealth: Payer: Self-pay

## 2023-05-10 DIAGNOSIS — E1165 Type 2 diabetes mellitus with hyperglycemia: Secondary | ICD-10-CM

## 2023-05-10 MED ORDER — SEMAGLUTIDE (2 MG/DOSE) 8 MG/3ML ~~LOC~~ SOPN: 2.0000 mg | PEN_INJECTOR | SUBCUTANEOUS | 2 refills | Status: DC

## 2023-05-10 NOTE — Telephone Encounter (Signed)
Med sent. Keep Dec appt  Meds ordered this encounter  Medications   Semaglutide, 2 MG/DOSE, 8 MG/3ML SOPN    Sig: Inject 2 mg as directed once a week.    Dispense:  3 mL    Refill:  2

## 2023-05-10 NOTE — Telephone Encounter (Signed)
Kelly Reeves is ready to go up to the next strength. She has a few more weeks of the 1 mg. She is ok with waiting until [place] returns.

## 2023-05-12 NOTE — Telephone Encounter (Signed)
Patient advised.

## 2023-05-24 DIAGNOSIS — Z6841 Body Mass Index (BMI) 40.0 and over, adult: Secondary | ICD-10-CM | POA: Diagnosis not present

## 2023-05-24 DIAGNOSIS — Z01419 Encounter for gynecological examination (general) (routine) without abnormal findings: Secondary | ICD-10-CM | POA: Diagnosis not present

## 2023-06-14 ENCOUNTER — Ambulatory Visit: Payer: BC Managed Care – PPO | Admitting: Physician Assistant

## 2023-06-30 ENCOUNTER — Ambulatory Visit: Payer: BC Managed Care – PPO | Admitting: Physician Assistant

## 2023-07-19 ENCOUNTER — Ambulatory Visit: Payer: BC Managed Care – PPO | Admitting: Physician Assistant

## 2023-08-06 ENCOUNTER — Other Ambulatory Visit: Payer: Self-pay | Admitting: Physician Assistant

## 2023-08-30 DIAGNOSIS — Z13820 Encounter for screening for osteoporosis: Secondary | ICD-10-CM | POA: Diagnosis not present

## 2023-09-01 LAB — HM DIABETES EYE EXAM

## 2023-09-12 NOTE — Progress Notes (Signed)
   Established Patient Office Visit  Subjective   Patient ID: Kelly Reeves, female    DOB: 05-02-1966  Age: 58 y.o. MRN: [id number]  CC: 3 month follow up   HPI Patient is a 58 yo female who presents today for management of type II diabetes. Patient states that she has been consistently taking her Ozempic injections on Sunday evenings. Last A1C in office was 6.1. She denies any nausea, vomiting, abdominal pain, chest pain, or shortness of breath.    .. Active Ambulatory Problems    Diagnosis Date Noted   Hypothyroidism 11/10/2010   Dyslipidemia, goal LDL below 70 10/22/2011   Essential hypertension 10/22/2011   PCOS (polycystic ovarian syndrome) 10/22/2011   Severe obesity (BMI >= 40) (HCC) 10/24/2012   Obesity, Class III, BMI 40-49.9 (morbid obesity) (HCC) 08/21/2013   Onychomycosis 08/21/2013   Class 3 severe obesity due to excess calories with serious comorbidity and body mass index (BMI) of 40.0 to 44.9 in adult (HCC) 10/16/2014   Thyroid activity decreased 10/16/2014   Abnormal weight gain 10/16/2014   Rash and nonspecific skin eruption 07/15/2015   Pre-diabetes 12/18/2015   Elevated liver enzymes 12/18/2015   Right ankle pain 12/29/2015   Gallstones 12/30/2015   Large breasts 02/17/2016   Morbid obesity (HCC) 05/18/2016   Hematuria 02/22/2017   Metabolic syndrome 12/21/2017   B12 deficiency 04/23/2019   Dyslipidemia 04/29/2019   Type 2 diabetes mellitus with hyperglycemia, without long-term current use of insulin (HCC) 08/02/2019   Anxiety 08/02/2019   Epigastric abdominal pain 03/25/2020   Nausea 03/25/2020   Anterior cervical adenopathy 09/20/2021   Elevated blood pressure reading 09/20/2021   Microalbuminuria 04/22/2022   Drug-induced constipation 09/02/2022   Gastroesophageal reflux disease without esophagitis 12/07/2022   Gastroesophageal reflux disease with esophagitis without hemorrhage 03/15/2023   Class 1 obesity due to excess calories with serious  comorbidity and body mass index (BMI) of 33.0 to 33.9 in adult 03/15/2023   Parotiditis 05/02/2023   Resolved Ambulatory Problems    Diagnosis Date Noted   Hypothyroidism 11/04/2011   Depression 01/13/2016   Past Medical History:  Diagnosis Date   Hyperlipidemia    Hypertension    Thyroid disease     ROS See HPI   Objective:    There were no vitals taken for this visit. {Vitals History (Optional):23777}  .Marland KitchenPhysical Exam Constitutional:      Appearance: Normal appearance.  HENT:     Head: Normocephalic and atraumatic.  Cardiovascular:     Rate and Rhythm: Normal rate and regular rhythm.     Pulses: Normal pulses.     Heart sounds: Normal heart sounds.  Pulmonary:     Effort: Pulmonary effort is normal.     Breath sounds: Normal breath sounds.  Musculoskeletal:        General: Normal range of motion.  Skin:    Capillary Refill: Capillary refill takes less than 2 seconds.  Neurological:     Mental Status: She is alert.  Psychiatric:        Mood and Affect: Mood normal.     Assessment & Plan:   - DM kidney evaluation due today  - HgB A1C today - Foot exam and eye exam UTD   Ilean China, Student-PA

## 2023-09-13 ENCOUNTER — Encounter: Payer: Self-pay | Admitting: Physician Assistant

## 2023-09-13 ENCOUNTER — Ambulatory Visit (INDEPENDENT_AMBULATORY_CARE_PROVIDER_SITE_OTHER): Payer: BC Managed Care – PPO | Admitting: Physician Assistant

## 2023-09-13 VITALS — BP 130/79 | HR 78 | Ht 64.0 in | Wt 251.0 lb

## 2023-09-13 DIAGNOSIS — E785 Hyperlipidemia, unspecified: Secondary | ICD-10-CM

## 2023-09-13 DIAGNOSIS — E66813 Obesity, class 3: Secondary | ICD-10-CM

## 2023-09-13 DIAGNOSIS — E1165 Type 2 diabetes mellitus with hyperglycemia: Secondary | ICD-10-CM

## 2023-09-13 DIAGNOSIS — I10 Essential (primary) hypertension: Secondary | ICD-10-CM

## 2023-09-13 DIAGNOSIS — Z6841 Body Mass Index (BMI) 40.0 and over, adult: Secondary | ICD-10-CM

## 2023-09-13 DIAGNOSIS — Z7984 Long term (current) use of oral hypoglycemic drugs: Secondary | ICD-10-CM

## 2023-09-13 DIAGNOSIS — F419 Anxiety disorder, unspecified: Secondary | ICD-10-CM

## 2023-09-13 LAB — POCT GLYCOSYLATED HEMOGLOBIN (HGB A1C): Hemoglobin A1C: 7 % — AB (ref 4.0–5.6)

## 2023-09-15 ENCOUNTER — Encounter: Payer: Self-pay | Admitting: Physician Assistant

## 2023-09-15 DIAGNOSIS — I10 Essential (primary) hypertension: Secondary | ICD-10-CM | POA: Insufficient documentation

## 2023-09-15 MED ORDER — BUPROPION HCL ER (XL) 150 MG PO TB24: 150.0000 mg | ORAL_TABLET | Freq: Every day | ORAL | 3 refills | Status: AC

## 2023-09-15 MED ORDER — HYDROCHLOROTHIAZIDE 25 MG PO TABS: 25.0000 mg | ORAL_TABLET | Freq: Every day | ORAL | 3 refills | Status: AC

## 2023-10-19 ENCOUNTER — Other Ambulatory Visit: Payer: Self-pay | Admitting: Physician Assistant

## 2023-10-19 DIAGNOSIS — K2100 Gastro-esophageal reflux disease with esophagitis, without bleeding: Secondary | ICD-10-CM

## 2023-11-04 ENCOUNTER — Other Ambulatory Visit: Payer: Self-pay | Admitting: Physician Assistant

## 2023-12-07 DIAGNOSIS — N760 Acute vaginitis: Secondary | ICD-10-CM | POA: Diagnosis not present

## 2023-12-07 DIAGNOSIS — N950 Postmenopausal bleeding: Secondary | ICD-10-CM | POA: Diagnosis not present

## 2023-12-13 ENCOUNTER — Ambulatory Visit (INDEPENDENT_AMBULATORY_CARE_PROVIDER_SITE_OTHER): Admitting: Physician Assistant

## 2023-12-13 VITALS — BP 124/97 | HR 97 | Ht 64.0 in | Wt 235.0 lb

## 2023-12-13 DIAGNOSIS — Z7985 Long-term (current) use of injectable non-insulin antidiabetic drugs: Secondary | ICD-10-CM

## 2023-12-13 DIAGNOSIS — I10 Essential (primary) hypertension: Secondary | ICD-10-CM | POA: Diagnosis not present

## 2023-12-13 DIAGNOSIS — E039 Hypothyroidism, unspecified: Secondary | ICD-10-CM

## 2023-12-13 DIAGNOSIS — E66813 Obesity, class 3: Secondary | ICD-10-CM | POA: Diagnosis not present

## 2023-12-13 DIAGNOSIS — E1165 Type 2 diabetes mellitus with hyperglycemia: Secondary | ICD-10-CM

## 2023-12-13 DIAGNOSIS — E785 Hyperlipidemia, unspecified: Secondary | ICD-10-CM

## 2023-12-13 DIAGNOSIS — Z6841 Body Mass Index (BMI) 40.0 and over, adult: Secondary | ICD-10-CM

## 2023-12-13 LAB — POCT GLYCOSYLATED HEMOGLOBIN (HGB A1C): Hemoglobin A1C: 5.7 % — AB (ref 4.0–5.6)

## 2023-12-13 LAB — POCT UA - MICROALBUMIN
Creatinine, POC: 300 mg/dL
Microalbumin Ur, POC: 80 mg/L

## 2023-12-13 MED ORDER — LEVOTHYROXINE SODIUM 88 MCG PO TABS: 88.0000 ug | ORAL_TABLET | Freq: Every day | ORAL | 0 refills | Status: DC

## 2023-12-13 NOTE — Progress Notes (Signed)
 Established Patient Office Visit  Subjective   Patient ID: Kelly Reeves, female    DOB: [birth year]  Age: 58 y.o. MRN: [id number]  Chief Complaint  Patient presents with   Medical Management of Chronic Issues    HPI Pt is a 58 yo obese female with T2dM, HTN, HLD, and hypothyroidism who presents to the clinic for 3 month follow up.   Pt is not checking sugars. She is taking ozempic  weekly. She is doing well with no side effects. She has lost 16lbs in 3 months. She is exercising. She denies any CP, palpitations, headaches or vision changes.      ROS See HPI.    Objective:     BP (!) 124/97   Pulse 97   Ht 5' 4 (1.626 m)   Wt 235 lb (106.6 kg)   SpO2 99%   BMI 40.34 kg/m  BP Readings from Last 3 Encounters:  12/13/23 (!) 124/97  09/13/23 130/79  05/02/23 (!) 160/78   Wt Readings from Last 3 Encounters:  12/13/23 235 lb (106.6 kg)  09/13/23 251 lb (113.9 kg)  05/02/23 240 lb (108.9 kg)  .Kelly Reeves Results for orders placed or performed in visit on 12/13/23  POCT HgB A1C   Collection Time: 12/13/23 11:43 AM  Result Value Ref Range   Hemoglobin A1C 5.7 (A) 4.0 - 5.6 %   HbA1c POC (<> result, manual entry)     HbA1c, POC (prediabetic range)     HbA1c, POC (controlled diabetic range)    POCT UA - Microalbumin   Collection Time: 12/13/23 11:44 AM  Result Value Ref Range   Microalbumin Ur, POC 80 mg/L   Creatinine, POC 300 mg/dL   Albumin/Creatinine Ratio, Urine, POC 30-300        Physical Exam Constitutional:      Appearance: Normal appearance. She is obese.  HENT:     Head: Normocephalic.   Cardiovascular:     Rate and Rhythm: Normal rate and regular rhythm.  Pulmonary:     Effort: Pulmonary effort is normal.     Breath sounds: Normal breath sounds.   Musculoskeletal:     Cervical back: No tenderness.     Right lower leg: No edema.     Left lower leg: No edema.  Lymphadenopathy:     Cervical: No cervical adenopathy.   Neurological:     General: No  focal deficit present.     Mental Status: She is alert and oriented to person, place, and time.   Psychiatric:        Mood and Affect: Mood normal.        The 10-year ASCVD risk score (Arnett DK, et al., 2019) is: 6.8%    Assessment & Plan:  Kelly AasAaron AasMysha was seen today for medical management of chronic issues.  Diagnoses and all orders for this visit:  Type 2 diabetes mellitus with hyperglycemia, without long-term current use of insulin (HCC) -     POCT HgB A1C -     POCT UA - Microalbumin  Class 3 severe obesity due to excess calories with serious comorbidity and body mass index (BMI) of 40.0 to 44.9 in adult  HTN, goal below 130/80  Dyslipidemia, goal LDL below 70  Acquired hypothyroidism -     levothyroxine  (SYNTHROID ) 88 MCG tablet; Take 1 tablet (88 mcg total) by mouth daily.   A1C to goal Keep up the healthy diet and exercise  Stay on ozempic  BP not to goal, continue on same medications  On statin Eye exam UTD Needs foot exam Vaccines UTD Follow up in 3 months  Thyroid  refilled today  Labs UTD    Return in about 3 months (around 03/14/2024).    Kelly Goates, PA-C

## 2023-12-15 ENCOUNTER — Encounter: Payer: Self-pay | Admitting: Physician Assistant

## 2023-12-26 DIAGNOSIS — N939 Abnormal uterine and vaginal bleeding, unspecified: Secondary | ICD-10-CM | POA: Diagnosis not present

## 2024-01-12 ENCOUNTER — Other Ambulatory Visit: Payer: Self-pay | Admitting: Physician Assistant

## 2024-01-12 DIAGNOSIS — E782 Mixed hyperlipidemia: Secondary | ICD-10-CM

## 2024-01-30 ENCOUNTER — Other Ambulatory Visit: Payer: Self-pay | Admitting: Physician Assistant

## 2024-02-21 ENCOUNTER — Other Ambulatory Visit (HOSPITAL_COMMUNITY): Payer: Self-pay

## 2024-02-21 ENCOUNTER — Telehealth: Payer: Self-pay

## 2024-02-21 NOTE — Telephone Encounter (Signed)
 Pharmacy Patient Advocate Encounter  Received notification from [facility] [employer] that Prior Authorization for Ozempic  8mg /70ml has been APPROVED from 02/21/24 to 02/20/25   PA #/Case ID/Reference #: [id number]

## 2024-02-21 NOTE — Telephone Encounter (Signed)
 Pharmacy Patient Advocate Encounter   Received notification from CoverMyMeds that prior authorization for Ozempic  8mg /6ml is due for renewal.   Insurance verification completed.   The patient is insured through [employer].  Action: PA required; PA submitted to above mentioned insurance via Latent Key/confirmation #/EOC [id number] Status is pending

## 2024-02-27 ENCOUNTER — Other Ambulatory Visit: Payer: Self-pay | Admitting: Physician Assistant

## 2024-02-27 ENCOUNTER — Other Ambulatory Visit (HOSPITAL_COMMUNITY): Payer: Self-pay

## 2024-03-11 ENCOUNTER — Other Ambulatory Visit: Payer: Self-pay | Admitting: Physician Assistant

## 2024-03-11 DIAGNOSIS — E039 Hypothyroidism, unspecified: Secondary | ICD-10-CM

## 2024-03-13 ENCOUNTER — Ambulatory Visit: Admitting: Physician Assistant

## 2024-04-17 ENCOUNTER — Ambulatory Visit (INDEPENDENT_AMBULATORY_CARE_PROVIDER_SITE_OTHER): Admitting: Physician Assistant

## 2024-04-17 VITALS — BP 123/82 | HR 81 | Ht 64.0 in | Wt 252.0 lb

## 2024-04-17 DIAGNOSIS — I10 Essential (primary) hypertension: Secondary | ICD-10-CM | POA: Diagnosis not present

## 2024-04-17 DIAGNOSIS — E039 Hypothyroidism, unspecified: Secondary | ICD-10-CM | POA: Diagnosis not present

## 2024-04-17 DIAGNOSIS — E782 Mixed hyperlipidemia: Secondary | ICD-10-CM

## 2024-04-17 DIAGNOSIS — E1165 Type 2 diabetes mellitus with hyperglycemia: Secondary | ICD-10-CM | POA: Diagnosis not present

## 2024-04-17 DIAGNOSIS — Z23 Encounter for immunization: Secondary | ICD-10-CM

## 2024-04-17 DIAGNOSIS — E66813 Obesity, class 3: Secondary | ICD-10-CM

## 2024-04-17 DIAGNOSIS — Z7985 Long-term (current) use of injectable non-insulin antidiabetic drugs: Secondary | ICD-10-CM

## 2024-04-17 DIAGNOSIS — Z6841 Body Mass Index (BMI) 40.0 and over, adult: Secondary | ICD-10-CM

## 2024-04-17 LAB — POCT GLYCOSYLATED HEMOGLOBIN (HGB A1C): Hemoglobin A1C: 8.7 % — AB (ref 4.0–5.6)

## 2024-04-17 NOTE — Progress Notes (Signed)
 Established Patient Office Visit  Subjective   Patient ID: ARIELL GUNNELS, female    DOB: August 27, 1965  Age: 58 y.o. MRN: [id number]  Chief Complaint  Patient presents with   Medical Management of Chronic Issues    HPI Discussed the use of AI scribe software for clinical note transcription with the patient, who gave verbal consent to proceed.  History of Present Illness CORYNN SOLBERG is a 58 year old female with type 2 diabetes, hyperlipidemia, hypertension, hypothyroidism and obesity who presents for a three month follow-up.  Weight gain and obesity - Weight increased from 235 to 252 pounds over the last three months - Current weight is 248 pounds after restarting Ozempic  two weeks ago - Home scale showed 256 pounds two weeks ago, indicating an 8-pound weight loss since restarting medication - Ozempic  restarted at 0.25 mg, resulting in decreased appetite and recent weight loss  Glycemic control - Hemoglobin A1c increased to 8.7%, higher than previous levels - Concern about elevated blood glucose and potential health impact - she is not checking her sugars - no awareness of hypoglycemic events - Discontinued diabetes medication prior to vacation and delayed resumption until two weeks ago - Family history of kidney disease; aunt died from kidney disease, increasing anxiety about glycemic control  Hypertension - Elevated blood pressure today at 140/90 mmHg, attributed to anxiety - Home blood pressure readings are usually within normal range - Blood pressure tends to decrease after calming down - Recently initiated walking for exercise  Pruritus - Itching occurs at night - No other new symptoms apart from nocturnal pruritus - she is concerned about her kidneys and liver  Hypothyroidism - needs TSH checked, on levothyroxine   Hyperlipdiemia - needs lipids check, on pitavastatin     ROS See HPI.    Objective:     BP (!) 140/90   Pulse 80   Ht 5' 4 (1.626 m)    Wt 252 lb (114.3 kg)   SpO2 99%   BMI 43.26 kg/m  BP Readings from Last 3 Encounters:  04/17/24 123/82  12/13/23 (!) 124/97  09/13/23 130/79   Wt Readings from Last 3 Encounters:  04/17/24 252 lb (114.3 kg)  12/13/23 235 lb (106.6 kg)  09/13/23 251 lb (113.9 kg)      Physical Exam Constitutional:      Appearance: Normal appearance. She is obese.  HENT:     Head: Normocephalic.  Cardiovascular:     Rate and Rhythm: Normal rate and regular rhythm.  Pulmonary:     Effort: Pulmonary effort is normal.     Breath sounds: Normal breath sounds.  Musculoskeletal:     Right lower leg: No edema.     Left lower leg: No edema.  Skin:    Findings: No rash.  Neurological:     Mental Status: She is alert.  Psychiatric:        Mood and Affect: Mood normal.      Results for orders placed or performed in visit on 04/17/24  POCT HgB A1C  Result Value Ref Range   Hemoglobin A1C 8.7 (A) 4.0 - 5.6 %   HbA1c POC (<> result, manual entry)     HbA1c, POC (prediabetic range)     HbA1c, POC (controlled diabetic range)      The 10-year ASCVD risk score (Arnett DK, et al., 2019) is: 8.6%    Assessment & Plan:  SABRASABRAAneri was seen today for medical management of chronic issues.  Diagnoses and all orders  for this visit:  Uncontrolled type 2 diabetes mellitus with hyperglycemia (HCC) -     POCT HgB A1C -     CMP14+EGFR -     Lipid panel -     TSH + free T4  Acquired hypothyroidism -     TSH + free T4  Class 3 severe obesity due to excess calories with serious comorbidity and body mass index (BMI) of 40.0 to 44.9 in adult [facility])  Mixed hyperlipidemia -     Lipid panel  HTN, goal below 130/80 -     CMP14+EGFR  Immunization due -     Flu vaccine trivalent PF, 6mos and older(Flulaval,Afluria,Fluarix,Fluzone)   Assessment & Plan Type 2 diabetes mellitus with hyperglycemia Poorly controlled with A1c of 8.7. She restarted Ozempic  at the lowest dose, losing 8 pounds in two weeks.  Concerns about family history of kidney disease and personal health were expressed. - Titrate Ozempic  to appropriate dose. - Order CMP to assess liver and kidney function. - Re-evaluate in three months. - Flu shot given today  Class 3 severe obesity due to excess calories Recent weight gain from 235 to 252 pounds over three months. Emotional factors and bereavement impacted weight management. Restarted Ozempic , losing 8 pounds in two weeks. - Continue Ozempic  for weight management. - Encourage adherence to lifestyle modifications including diet and exercise.  Hypertension Current reading 140/90 mmHg, higher than usual. Anxiety and stress may contribute. Reports normal readings at home. - Recheck blood pressure during visit and returned back to normal.  - Encourage regular home blood pressure monitoring.  Dyslipidemia - Lipid panel ordered - on Pitavastatin   Acquired hypothyroidism - TSH ordered, She is on Levothyroxine  88 MCG daily.     Kriston Mckinnie, PA-C

## 2024-04-18 LAB — CMP14+EGFR
ALT: 26 IU/L (ref 0–32)
AST: 29 IU/L (ref 0–40)
Albumin: 4.5 g/dL (ref 3.8–4.9)
Alkaline Phosphatase: 143 IU/L — ABNORMAL HIGH (ref 49–135)
BUN/Creatinine Ratio: 19 (ref 9–23)
BUN: 11 mg/dL (ref 6–24)
Bilirubin Total: 0.4 mg/dL (ref 0.0–1.2)
CO2: 22 mmol/L (ref 20–29)
Calcium: 10 mg/dL (ref 8.7–10.2)
Chloride: 94 mmol/L — ABNORMAL LOW (ref 96–106)
Creatinine, Ser: 0.59 mg/dL (ref 0.57–1.00)
Globulin, Total: 3 g/dL (ref 1.5–4.5)
Glucose: 144 mg/dL — ABNORMAL HIGH (ref 70–99)
Potassium: 3.8 mmol/L (ref 3.5–5.2)
Sodium: 134 mmol/L (ref 134–144)
Total Protein: 7.5 g/dL (ref 6.0–8.5)
eGFR: 105 mL/min/1.73 (ref >59)

## 2024-04-18 LAB — LIPID PANEL
Chol/HDL Ratio: 4.9 ratio — ABNORMAL HIGH (ref 0.0–4.4)
Cholesterol, Total: 217 mg/dL — ABNORMAL HIGH (ref 100–199)
HDL: 44 mg/dL (ref >39)
LDL Chol Calc (NIH): 135 mg/dL — ABNORMAL HIGH (ref 0–99)
Triglycerides: 213 mg/dL — ABNORMAL HIGH (ref 0–149)
VLDL Cholesterol Cal: 38 mg/dL (ref 5–40)

## 2024-04-18 LAB — TSH+FREE T4
Free T4: 1.85 ng/dL — ABNORMAL HIGH (ref 0.82–1.77)
TSH: 0.017 u[IU]/mL — ABNORMAL LOW (ref 0.450–4.500)

## 2024-04-19 ENCOUNTER — Encounter: Payer: Self-pay | Admitting: Physician Assistant

## 2024-04-19 ENCOUNTER — Ambulatory Visit: Payer: Self-pay | Admitting: Physician Assistant

## 2024-04-19 DIAGNOSIS — Z23 Encounter for immunization: Secondary | ICD-10-CM | POA: Diagnosis not present

## 2024-04-19 DIAGNOSIS — E782 Mixed hyperlipidemia: Secondary | ICD-10-CM | POA: Insufficient documentation

## 2024-04-19 NOTE — Progress Notes (Signed)
 Kelly Reeves                                          MRN: [id number]   04/19/2024   The [facility] Quality Team Specialist reviewed this patient medical record for the purposes of chart review for care gap closure. The following were reviewed: abstraction for care gap closure-controlling blood pressure.    [facility] Quality Team

## 2024-04-19 NOTE — Progress Notes (Signed)
 Kelly Reeves,   Thyroid  levels are too high. Decreased to 75mcg daily. Recheck in 3 months.  LDL not to goal. Are you taking the pitavastatin  daily?

## 2024-04-23 MED ORDER — LEVOTHYROXINE SODIUM 75 MCG PO TABS: 75.0000 ug | ORAL_TABLET | Freq: Every day | ORAL | 0 refills | Status: DC

## 2024-06-05 ENCOUNTER — Other Ambulatory Visit: Payer: Self-pay | Admitting: Obstetrics and Gynecology

## 2024-06-05 DIAGNOSIS — Z1231 Encounter for screening mammogram for malignant neoplasm of breast: Secondary | ICD-10-CM

## 2024-06-07 ENCOUNTER — Inpatient Hospital Stay
Admission: RE | Admit: 2024-06-07 | Discharge: 2024-06-07 | Attending: Obstetrics and Gynecology | Admitting: Obstetrics and Gynecology

## 2024-06-07 DIAGNOSIS — Z1231 Encounter for screening mammogram for malignant neoplasm of breast: Secondary | ICD-10-CM | POA: Diagnosis not present

## 2024-06-17 ENCOUNTER — Other Ambulatory Visit: Payer: Self-pay | Admitting: Family Medicine

## 2024-06-17 ENCOUNTER — Other Ambulatory Visit: Payer: Self-pay | Admitting: Physician Assistant

## 2024-06-17 DIAGNOSIS — E1165 Type 2 diabetes mellitus with hyperglycemia: Secondary | ICD-10-CM

## 2024-06-17 DIAGNOSIS — K2100 Gastro-esophageal reflux disease with esophagitis, without bleeding: Secondary | ICD-10-CM

## 2024-06-18 ENCOUNTER — Other Ambulatory Visit: Payer: Self-pay | Admitting: Physician Assistant

## 2024-06-18 DIAGNOSIS — E1165 Type 2 diabetes mellitus with hyperglycemia: Secondary | ICD-10-CM

## 2024-06-18 MED ORDER — SEMAGLUTIDE (2 MG/DOSE) 8 MG/3ML ~~LOC~~ SOPN: 2.0000 mg | PEN_INJECTOR | SUBCUTANEOUS | 2 refills | Status: DC

## 2024-06-18 NOTE — Telephone Encounter (Signed)
 Per the last office note - patient was to restart the Ozempic  rx.

## 2024-07-17 ENCOUNTER — Ambulatory Visit: Admitting: Physician Assistant

## 2024-07-23 ENCOUNTER — Ambulatory Visit: Admitting: Physician Assistant

## 2024-07-23 ENCOUNTER — Encounter: Payer: Self-pay | Admitting: Physician Assistant

## 2024-07-23 ENCOUNTER — Other Ambulatory Visit: Payer: Self-pay | Admitting: Physician Assistant

## 2024-07-23 VITALS — BP 138/80 | HR 94 | Ht 64.0 in | Wt 245.0 lb

## 2024-07-23 DIAGNOSIS — E039 Hypothyroidism, unspecified: Secondary | ICD-10-CM | POA: Diagnosis not present

## 2024-07-23 DIAGNOSIS — D649 Anemia, unspecified: Secondary | ICD-10-CM | POA: Diagnosis not present

## 2024-07-23 DIAGNOSIS — E785 Hyperlipidemia, unspecified: Secondary | ICD-10-CM | POA: Diagnosis not present

## 2024-07-23 DIAGNOSIS — I10 Essential (primary) hypertension: Secondary | ICD-10-CM

## 2024-07-23 DIAGNOSIS — E1165 Type 2 diabetes mellitus with hyperglycemia: Secondary | ICD-10-CM

## 2024-07-23 DIAGNOSIS — Z7985 Long-term (current) use of injectable non-insulin antidiabetic drugs: Secondary | ICD-10-CM

## 2024-07-23 DIAGNOSIS — E1169 Type 2 diabetes mellitus with other specified complication: Secondary | ICD-10-CM

## 2024-07-23 LAB — POCT GLYCOSYLATED HEMOGLOBIN (HGB A1C): Hemoglobin A1C: 6.7 % — AB (ref 4.0–5.6)

## 2024-07-23 MED ORDER — SEMAGLUTIDE (1 MG/DOSE) 4 MG/3ML ~~LOC~~ SOPN: 1.0000 mg | PEN_INJECTOR | SUBCUTANEOUS | 0 refills | Status: AC

## 2024-07-23 MED ORDER — SEMAGLUTIDE (2 MG/DOSE) 8 MG/3ML ~~LOC~~ SOPN: 2.0000 mg | PEN_INJECTOR | SUBCUTANEOUS | 2 refills | Status: AC

## 2024-07-23 NOTE — Patient Instructions (Signed)
 Ozempic  1mg  weekly for a month then increase to 2mg  weekly.

## 2024-07-23 NOTE — Progress Notes (Unsigned)
 "  Established Patient Office Visit  Subjective   Patient ID: Kelly Reeves, female    DOB: 1966/06/28  Age: 59 y.o. MRN: [id number]  Chief Complaint  Patient presents with   Medical Management of Chronic Issues    HPI .Discussed the use of AI scribe software for clinical note transcription with the patient, who gave verbal consent to proceed.  History of Present Illness Kelly Reeves is a 59 year old female with obesity, T2DM, hypothyroidism and hypertension who presents for follow-up on weight management, glucose control, and hypertension.   Weight management - Obesity with ongoing efforts at weight reduction - Currently taking semaglutide  1 mg; missed last dose on Wednesday due to refill issue - Tolerates semaglutide  without adverse effects - Weight decreased by 8 pounds since October, but less than expected - No current exercise due to cold weather  Hypertension - Blood pressure normal at recent gynecology visit - Elevated blood pressure reading today attributed to rushing to appointment - Currently taking hydrochlorothiazide  and spironolactone  - Experiences frequent urination and increased fluid intake  Hypothyroidism - taking levothyroxine  75mcg daily - last labs were not to goal  Glycemic control - Hemoglobin A1c improved from 8.7% to 6.7% over the past three months - Not regularly monitoring blood glucose at home - No recent soda consumption; stopped making tea - Up to date on eye exams - Not exercising - Compliant with ozempic  but has not moved up to 2mg  dosing yet  Medication adherence and other medical issues - Taking bupropion  and all prescribed medications except those not refilled - Iron levels previously noted to be low; awaiting further evaluation     ROS See HPI.    Objective:     BP 138/80   Pulse 94   Ht 5' 4 (1.626 m)   Wt 245 lb (111.1 kg)   SpO2 99%   BMI 42.05 kg/m  BP Readings from Last 3 Encounters:  07/23/24 138/80   04/17/24 123/82  12/13/23 (!) 124/97   Wt Readings from Last 3 Encounters:  07/23/24 245 lb (111.1 kg)  04/17/24 252 lb (114.3 kg)  12/13/23 235 lb (106.6 kg)    . Results for orders placed or performed in visit on 07/23/24  POCT HgB A1C   Collection Time: 07/23/24 10:51 AM  Result Value Ref Range   Hemoglobin A1C 6.7 (A) 4.0 - 5.6 %   HbA1c POC (<> result, manual entry)     HbA1c, POC (prediabetic range)     HbA1c, POC (controlled diabetic range)    TSH + free T4   Collection Time: 07/23/24 11:16 AM  Result Value Ref Range   TSH 0.029 (L) 0.450 - 4.500 uIU/mL   Free T4 1.53 0.82 - 1.77 ng/dL  CBC w/Diff/Platelet   Collection Time: 07/23/24 11:16 AM  Result Value Ref Range   WBC 9.0 3.4 - 10.8 x10E3/uL   RBC 5.07 3.77 - 5.28 x10E6/uL   Hemoglobin 11.3 11.1 - 15.9 g/dL   Hematocrit 62.1 65.9 - 46.6 %   MCV 75 (L) 79 - 97 fL   MCH 22.3 (L) 26.6 - 33.0 pg   MCHC 29.9 (L) 31.5 - 35.7 g/dL   RDW 81.5 (H) 88.2 - 84.5 %   Platelets 573 (H) 150 - 450 x10E3/uL   Neutrophils 60 Not Estab. %   Lymphs 29 Not Estab. %   Monocytes 8 Not Estab. %   Eos 2 Not Estab. %   Basos 1 Not Estab. %  Neutrophils Absolute 5.4 1.4 - 7.0 x10E3/uL   Lymphocytes Absolute 2.6 0.7 - 3.1 x10E3/uL   Monocytes Absolute 0.7 0.1 - 0.9 x10E3/uL   EOS (ABSOLUTE) 0.1 0.0 - 0.4 x10E3/uL   Basophils Absolute 0.1 0.0 - 0.2 x10E3/uL   Immature Granulocytes 0 Not Estab. %   Immature Grans (Abs) 0.0 0.0 - 0.1 x10E3/uL  Fe+TIBC+Fer   Collection Time: 07/23/24 11:16 AM  Result Value Ref Range   Total Iron Binding Capacity 475 (H) 250 - 450 ug/dL   UIBC 551 (H) 868 - 574 ug/dL   Iron 27 27 - 840 ug/dL   Iron Saturation 6 (LL) 15 - 55 %   Ferritin 9 (L) 15 - 150 ng/mL  CMP14+EGFR   Collection Time: 07/23/24 11:16 AM  Result Value Ref Range   Glucose 140 (H) 70 - 99 mg/dL   BUN 12 6 - 24 mg/dL   Creatinine, Ser 9.26 0.57 - 1.00 mg/dL   eGFR 96 >40 fO/fpw/8.26   BUN/Creatinine Ratio 16 9 - 23   Sodium  138 134 - 144 mmol/L   Potassium 4.1 3.5 - 5.2 mmol/L   Chloride 97 96 - 106 mmol/L   CO2 24 20 - 29 mmol/L   Calcium  10.4 (H) 8.7 - 10.2 mg/dL   Total Protein 7.7 6.0 - 8.5 g/dL   Albumin 4.4 3.8 - 4.9 g/dL   Globulin, Total 3.3 1.5 - 4.5 g/dL   Bilirubin Total 0.4 0.0 - 1.2 mg/dL   Alkaline Phosphatase 144 (H) 49 - 135 IU/L   AST 30 0 - 40 IU/L   ALT 22 0 - 32 IU/L      Physical Exam Constitutional:      Appearance: Normal appearance. She is obese.  HENT:     Head: Normocephalic.  Cardiovascular:     Rate and Rhythm: Normal rate and regular rhythm.  Pulmonary:     Effort: Pulmonary effort is normal.     Breath sounds: Normal breath sounds.  Neurological:     General: No focal deficit present.     Mental Status: She is alert and oriented to person, place, and time.  Psychiatric:        Mood and Affect: Mood normal.      The 10-year ASCVD risk score (Arnett DK, et al., 2019) is: 9.3%    Assessment & Plan:  .Kelly Reeves was seen today for medical management of chronic issues.  Diagnoses and all orders for this visit:  Hyperlipidemia associated with type 2 diabetes mellitus (HCC) -     POCT HgB A1C -     Semaglutide , 1 MG/DOSE, 4 MG/3ML SOPN; Inject 1 mg into the skin once a week. -     Semaglutide , 2 MG/DOSE, 8 MG/3ML SOPN; Inject 2 mg as directed once a week. -     TSH + free T4 -     CBC w/Diff/Platelet -     Fe+TIBC+Fer -     CMP14+EGFR  Acquired hypothyroidism -     TSH + free T4 -     CMP14+EGFR  Essential hypertension -     CMP14+EGFR  Severe obesity (BMI >= 40) (HCC)  Low hemoglobin -     CBC w/Diff/Platelet -     Fe+TIBC+Fer   Assessment & Plan Type 2 diabetes mellitus A1C to goal and under 7 today.  - continue ozempic  1mg  weekly for 4 weeks then increase to 2mg  weekly.  - Eye and foot exam UTD - Encouraged to stay on  livalo  daily - BP close to goal  Essential hypertension Blood pressure elevated today, likely due to rushing. Current regimen  effective. - Continue hydrochlorothiazide  and spironolactone .  Severe obesity Lost 8 pounds since October. Missed semaglutide  dose due to refill issue. Tolerates medication well with dietary adherence. Plan to increase dose to 2 mg. - Sent prescription for semaglutide  2 mg.  Acquired hypothyroidism Due for thyroid  function test. - Ordered thyroid  function test.  Anemia Informed of low iron levels. Further evaluation needed. - Evaluated iron levels and considered further management.    Return in about 3 months (around 10/21/2024).    Lacole Komorowski, PA-C  "

## 2024-07-24 ENCOUNTER — Ambulatory Visit: Payer: Self-pay | Admitting: Physician Assistant

## 2024-07-24 ENCOUNTER — Encounter: Payer: Self-pay | Admitting: Physician Assistant

## 2024-07-24 DIAGNOSIS — D649 Anemia, unspecified: Secondary | ICD-10-CM | POA: Insufficient documentation

## 2024-07-24 LAB — TSH+FREE T4
Free T4: 1.53 ng/dL (ref 0.82–1.77)
TSH: 0.029 u[IU]/mL — ABNORMAL LOW (ref 0.450–4.500)

## 2024-07-24 LAB — CMP14+EGFR
ALT: 22 IU/L (ref 0–32)
AST: 30 IU/L (ref 0–40)
Albumin: 4.4 g/dL (ref 3.8–4.9)
Alkaline Phosphatase: 144 IU/L — ABNORMAL HIGH (ref 49–135)
BUN/Creatinine Ratio: 16 (ref 9–23)
BUN: 12 mg/dL (ref 6–24)
Bilirubin Total: 0.4 mg/dL (ref 0.0–1.2)
CO2: 24 mmol/L (ref 20–29)
Calcium: 10.4 mg/dL — ABNORMAL HIGH (ref 8.7–10.2)
Chloride: 97 mmol/L (ref 96–106)
Creatinine, Ser: 0.73 mg/dL (ref 0.57–1.00)
Globulin, Total: 3.3 g/dL (ref 1.5–4.5)
Glucose: 140 mg/dL — ABNORMAL HIGH (ref 70–99)
Potassium: 4.1 mmol/L (ref 3.5–5.2)
Sodium: 138 mmol/L (ref 134–144)
Total Protein: 7.7 g/dL (ref 6.0–8.5)
eGFR: 96 mL/min/1.73

## 2024-07-24 LAB — CBC WITH DIFFERENTIAL/PLATELET
Basophils Absolute: 0.1 x10E3/uL (ref 0.0–0.2)
Basos: 1 %
EOS (ABSOLUTE): 0.1 x10E3/uL (ref 0.0–0.4)
Eos: 2 %
Hematocrit: 37.8 % (ref 34.0–46.6)
Hemoglobin: 11.3 g/dL (ref 11.1–15.9)
Immature Grans (Abs): 0 x10E3/uL (ref 0.0–0.1)
Immature Granulocytes: 0 %
Lymphocytes Absolute: 2.6 x10E3/uL (ref 0.7–3.1)
Lymphs: 29 %
MCH: 22.3 pg — ABNORMAL LOW (ref 26.6–33.0)
MCHC: 29.9 g/dL — ABNORMAL LOW (ref 31.5–35.7)
MCV: 75 fL — ABNORMAL LOW (ref 79–97)
Monocytes Absolute: 0.7 x10E3/uL (ref 0.1–0.9)
Monocytes: 8 %
Neutrophils Absolute: 5.4 x10E3/uL (ref 1.4–7.0)
Neutrophils: 60 %
Platelets: 573 x10E3/uL — ABNORMAL HIGH (ref 150–450)
RBC: 5.07 x10E6/uL (ref 3.77–5.28)
RDW: 18.4 % — ABNORMAL HIGH (ref 11.7–15.4)
WBC: 9 x10E3/uL (ref 3.4–10.8)

## 2024-07-24 LAB — IRON,TIBC AND FERRITIN PANEL
Ferritin: 9 ng/mL — ABNORMAL LOW (ref 15–150)
Iron Saturation: 6 % — CL (ref 15–55)
Iron: 27 ug/dL (ref 27–159)
Total Iron Binding Capacity: 475 ug/dL — ABNORMAL HIGH (ref 250–450)
UIBC: 448 ug/dL — ABNORMAL HIGH (ref 131–425)

## 2024-07-24 NOTE — Progress Notes (Signed)
 Ashantia,   T4 in normal range. If feeling good we can stay here.   You are pretty anemic. Can you tolerate oral iron?

## 2024-10-23 ENCOUNTER — Ambulatory Visit: Admitting: Physician Assistant
